# Patient Record
Sex: Male | Born: 1965 | Race: White | Hispanic: No | Marital: Single | Smoking: Current some day smoker
Health system: Southern US, Community
[De-identification: ages and names within clinical notes are randomized; demographics above are authoritative.]

## PROBLEM LIST (undated history)

## (undated) DIAGNOSIS — K219 Gastro-esophageal reflux disease without esophagitis: Secondary | ICD-10-CM

## (undated) HISTORY — DX: Gastro-esophageal reflux disease without esophagitis: K21.9

## (undated) HISTORY — PX: COLONOSCOPY: SHX174

---

## 2019-02-05 ENCOUNTER — Other Ambulatory Visit: Payer: Self-pay | Admitting: Internal Medicine

## 2019-02-05 DIAGNOSIS — R1084 Generalized abdominal pain: Secondary | ICD-10-CM

## 2019-02-05 DIAGNOSIS — R109 Unspecified abdominal pain: Secondary | ICD-10-CM

## 2019-02-08 ENCOUNTER — Other Ambulatory Visit: Payer: Self-pay

## 2019-02-08 ENCOUNTER — Ambulatory Visit
Admission: RE | Admit: 2019-02-08 | Discharge: 2019-02-08 | Disposition: A | Discharge status: Home / Self Care | Payer: PRIVATE HEALTH INSURANCE | Source: Ambulatory Visit | Attending: Internal Medicine | Admitting: Internal Medicine

## 2019-02-08 DIAGNOSIS — R109 Unspecified abdominal pain: Secondary | ICD-10-CM | POA: Diagnosis present

## 2019-02-08 DIAGNOSIS — R1084 Generalized abdominal pain: Secondary | ICD-10-CM | POA: Insufficient documentation

## 2019-02-08 MED ORDER — IOHEXOL 300 MG/ML  SOLN
100.0000 mL | Freq: Once | INTRAMUSCULAR | Status: AC | PRN
  Administered 2019-02-08: 100 mL via INTRAVENOUS

## 2019-04-02 ENCOUNTER — Other Ambulatory Visit: Payer: Self-pay | Admitting: Internal Medicine

## 2019-04-02 ENCOUNTER — Other Ambulatory Visit: Payer: Self-pay

## 2019-04-02 ENCOUNTER — Ambulatory Visit
Admission: RE | Admit: 2019-04-02 | Discharge: 2019-04-02 | Disposition: A | Discharge status: Home / Self Care | Payer: PRIVATE HEALTH INSURANCE | Source: Ambulatory Visit | Attending: Internal Medicine | Admitting: Internal Medicine

## 2019-04-02 DIAGNOSIS — K810 Acute cholecystitis: Secondary | ICD-10-CM

## 2019-04-15 ENCOUNTER — Ambulatory Visit (INDEPENDENT_AMBULATORY_CARE_PROVIDER_SITE_OTHER): Payer: PRIVATE HEALTH INSURANCE | Admitting: General Surgery

## 2019-04-15 ENCOUNTER — Other Ambulatory Visit: Payer: Self-pay

## 2019-04-15 ENCOUNTER — Encounter: Payer: Self-pay | Admitting: General Surgery

## 2019-04-15 VITALS — BP 151/94 | HR 76 | Temp 97.7°F | Ht 67.0 in | Wt 177.0 lb

## 2019-04-15 DIAGNOSIS — K8020 Calculus of gallbladder without cholecystitis without obstruction: Secondary | ICD-10-CM

## 2019-04-15 MED ORDER — HYOSCYAMINE SULFATE 0.125 MG SL SUBL: 0.1250 mg | SUBLINGUAL_TABLET | SUBLINGUAL | 0 refills | Status: AC | PRN

## 2019-04-15 NOTE — H&P (View-Only) (Signed)
Patient ID: Alejandro Patel, male   DOB: 02/11/1966, 53 y.o.   MRN: [id number]  Chief Complaint  Patient presents with  . Abdominal Pain    HPI Alejandro Patel is a 53 y.o. male here today for a evaluation of his gallbladder. Patient states he has been having pain off and on for three years now, located in his right upper quadrant. He states his last attack was last week. Nauseas and vomiting. Move his bowels daily. He had a ultrasound and ct scan done.  The patient's first severe episode, bad enough to bring him to medical attention, was in early March.  A CT scan at that time documented cholelithiasis, but the radiologist attention had been directed towards the right lower quadrant where a normal appendix was appreciated.  Since that time the patient has had episodes with pain and nausea, relieved by vomiting.  One episode was triggered by pizza, another by chicken.  The patient has been fairly asymptomatic for the last week.  The next severe episode was on May 1 at which time he was evaluated again by his primary care physician at which time an abdominal ultrasound was obtained.  This showed findings suggestive of cholelithiasis.  A telemedicine visit with Dr. Sakai was undertaken on May 4 and then in an office visit on Apr 13, 2019.  The patient had been frustrated with the mixed messages about the availability for surgery, the confusion the result of the recent shutdown of elective surgery due to the [place] Corona virus pandemic.  He recall that I had cared for his mother Alejandro Patel years ago, and came for a second opinion looking for nonsurgical options.  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     HPI  Past Medical History:  Diagnosis Date  . GERD (gastroesophageal reflux disease)     History reviewed. No pertinent surgical history.  History reviewed. No pertinent family history.  Social History Social History   Tobacco Use  . Smoking status: Current Every Day  Smoker    Packs/day: 0.25  . Smokeless tobacco: Never Used  Substance Use Topics  . Alcohol use: Yes  . Drug use: Never    Allergies  Allergen Reactions  . Codeine Hives and Itching    Current Outpatient Medications  Medication Sig Dispense Refill  . famotidine (PEPCID) 40 MG tablet TAKE 1 TABLET BY MOUTH EVERY DAY AT NIGHT    . pantoprazole (PROTONIX) 40 MG tablet Take by mouth.    . Zinc Sulfate (ZINC 15 PO) Take by mouth.    . hyoscyamine (LEVSIN SL) 0.125 MG SL tablet Place 1 tablet (0.125 mg total) under the tongue every 4 (four) hours as needed. 10 tablet 0   No current facility-administered medications for this visit.     Review of Systems Review of Systems  Constitutional: Negative.   Respiratory: Negative.   Gastrointestinal: Positive for abdominal pain, nausea and vomiting.    Blood pressure (!) 151/94, pulse 76, temperature 97.7 F (36.5 C), temperature source Skin, height 5' 7" (1.702 m), weight 177 lb (80.3 kg), SpO2 98 %.  Physical Exam Physical Exam Constitutional:      Appearance: He is well-developed.  HENT:     Mouth/Throat:     Pharynx: No oropharyngeal exudate.  Eyes:     General: No scleral icterus.    Conjunctiva/sclera: Conjunctivae normal.  Neck:     Musculoskeletal: Neck supple.  Cardiovascular:     Rate and Rhythm: Normal rate and   regular rhythm.     Pulses:          Femoral pulses are 2+ on the right side and 2+ on the left side.    Heart sounds: Normal heart sounds.  Pulmonary:     Effort: Pulmonary effort is normal.     Breath sounds: Normal breath sounds.  Abdominal:     General: Bowel sounds are normal.     Palpations: Abdomen is soft.     Tenderness: There is abdominal tenderness.    Musculoskeletal:     Right lower leg: No edema.     Left lower leg: No edema.  Skin:    General: Skin is warm and dry.  Neurological:     Mental Status: He is alert and oriented to person, place, and time.  Psychiatric:        Behavior:  Behavior normal.     Data Reviewed I independently reviewed the February 09, 2019 CT of the abdomen.  There appeared to be increased enhancement of the gallbladder wall as well as the identified cholelithiasis noted by the radiologist.  The abdominal ultrasound of Apr 02, 2019 was also independently reviewed showing modest gallbladder wall thickening at 4.1 mm.  Common bile duct was 2.9 cm.  Sludge and gallstones measuring up to 1.5 cm were noted.  A negative sonographic Eulah Pont sign was reported.  Laboratory studies dated February 05, 2019 showed a white blood cell count of 13,800 with a hemoglobin of 16.5 and an MCV of 99.  Normal platelet count of 225,000.  Normal differential with 66% neutrophils.  Comprehensive metabolic panel the same date was unremarkable.  Normal liver function studies.  Normal creatinine at 1.0 with an estimated GFR 78.  Low blood sugar of 63.  Repeat CBC today showed a normal white blood cell count of 9500 and a normal differential.   Assessment Chronic cholecystitis and cholelithiasis with intermittent exacerbation.  Plan  The patient is very much a believer in the almanac and doing events during the appropriate "sign".  He does not want to have surgery at this time unless absolutely necessary, preferring to defer until the June 8-12, 2020 window.  With a normal CBC and his report of marked improvement with the as needed use of meloxicam, I think this timeline can be accommodated.  I have asked him to make use of the meloxicam for 7.5 mg daily (he presently has some from years ago) and was prescribed Levsin SL to use 1 tablet sublingually as needed for severe pain.  He is to report if he requires use of this more than once or twice.  Laparoscopic Cholecystectomy with Intraoperative Cholangiogram. The procedure, including it's potential risks and complications (including but not limited to infection, bleeding, injury to intra-abdominal organs or bile ducts, bile leak, poor  cosmetic result, sepsis and death) were discussed with the patient in detail. Non-operative options, including their inherent risks (acute calculous cholecystitis with possible choledocholithiasis or gallstone pancreatitis, with the risk of ascending cholangitis, sepsis, and death) were discussed as well. The patient expressed and understanding of what we discussed and wishes to proceed with laparoscopic cholecystectomy. The patient further understands that if it is technically not possible, or it is unsafe to proceed laparoscopically, that I will convert to an open cholecystectomy.   HPI, assessment, plan and physical exam has been scribed under the direction and in the presence of Earline Mayotte, MD. Dorathy Daft, RN  HPI, Physical Exam, Assessment and Plan have been scribed under  the direction and in the presence of Donnalee CurryJeffrey Rosabelle Jupin, MD.  Ples SpecterJessica Qualls, CMA  I have completed the exam and reviewed the above documentation for accuracy and completeness.  I agree with the above.  [employer] has been used and any errors in dictation or transcription are unintentional.  Donnalee CurryJeffrey Teran Knittle, M.D., F.A.C.S.  Merrily PewJeffrey W Kyliegh Jester 04/16/2019, 8:08 AM

## 2019-04-15 NOTE — Progress Notes (Signed)
Patient ID: Alejandro Patel, male   DOB: [birth year], 53 y.o.   MRN: [id number]  Chief Complaint  Patient presents with  . Abdominal Pain    HPI Alejandro Patel is a 53 y.o. male here today for a evaluation of his gallbladder. Patient states he has been having pain off and on for three years now, located in his right upper quadrant. He states his last attack was last week. Nauseas and vomiting. Move his bowels daily. He had a ultrasound and ct scan done.  The patient's first severe episode, bad enough to bring him to medical attention, was in early March.  A CT scan at that time documented cholelithiasis, but the radiologist attention had been directed towards the right lower quadrant where a normal appendix was appreciated.  Since that time the patient has had episodes with pain and nausea, relieved by vomiting.  One episode was triggered by pizza, another by chicken.  The patient has been fairly asymptomatic for the last week.  The next severe episode was on May 1 at which time he was evaluated again by his primary care physician at which time an abdominal ultrasound was obtained.  This showed findings suggestive of cholelithiasis.  A telemedicine visit with Dr. Tonna Boehringer was undertaken on May 4 and then in an office visit on Apr 13, 2019.  The patient had been frustrated with the mixed messages about the availability for surgery, the confusion the result of the recent shutdown of elective surgery due to the [facility] virus pandemic.  He recall that I had cared for his mother Alejandro Patel years ago, and came for a second opinion looking for nonsurgical options.  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     HPI  Past Medical History:  Diagnosis Date  . GERD (gastroesophageal reflux disease)     History reviewed. No pertinent surgical history.  History reviewed. No pertinent family history.  Social History Social History   Tobacco Use  . Smoking status: Current Every Day  Smoker    Packs/day: 0.25  . Smokeless tobacco: Never Used  Substance Use Topics  . Alcohol use: Yes  . Drug use: Never    Allergies  Allergen Reactions  . Codeine Hives and Itching    Current Outpatient Medications  Medication Sig Dispense Refill  . famotidine (PEPCID) 40 MG tablet TAKE 1 TABLET BY MOUTH EVERY DAY AT NIGHT    . pantoprazole (PROTONIX) 40 MG tablet Take by mouth.    . Zinc Sulfate (ZINC 15 PO) Take by mouth.    . hyoscyamine (LEVSIN SL) 0.125 MG SL tablet Place 1 tablet (0.125 mg total) under the tongue every 4 (four) hours as needed. 10 tablet 0   No current facility-administered medications for this visit.     Review of Systems Review of Systems  Constitutional: Negative.   Respiratory: Negative.   Gastrointestinal: Positive for abdominal pain, nausea and vomiting.    Blood pressure (!) 151/94, pulse 76, temperature 97.7 F (36.5 C), temperature source Skin, height  (1.702 m), weight 177 lb (80.3 kg), SpO2 98 %.  Physical Exam Physical Exam Constitutional:      Appearance: He is well-developed.  HENT:     Mouth/Throat:     Pharynx: No oropharyngeal exudate.  Eyes:     General: No scleral icterus.    Conjunctiva/sclera: Conjunctivae normal.  Neck:     Musculoskeletal: Neck supple.  Cardiovascular:     Rate and Rhythm: Normal rate and  regular rhythm.     Pulses:          Femoral pulses are 2+ on the right side and 2+ on the left side.    Heart sounds: Normal heart sounds.  Pulmonary:     Effort: Pulmonary effort is normal.     Breath sounds: Normal breath sounds.  Abdominal:     General: Bowel sounds are normal.     Palpations: Abdomen is soft.     Tenderness: There is abdominal tenderness.    Musculoskeletal:     Right lower leg: No edema.     Left lower leg: No edema.  Skin:    General: Skin is warm and dry.  Neurological:     Mental Status: He is alert and oriented to person, place, and time.  Psychiatric:        Behavior:  Behavior normal.     Data Reviewed I independently reviewed the February 09, 2019 CT of the abdomen.  There appeared to be increased enhancement of the gallbladder wall as well as the identified cholelithiasis noted by the radiologist.  The abdominal ultrasound of Apr 02, 2019 was also independently reviewed showing modest gallbladder wall thickening at 4.1 mm.  Common bile duct was 2.9 cm.  Sludge and gallstones measuring up to 1.5 cm were noted.  A negative sonographic Eulah Pont sign was reported.  Laboratory studies dated February 05, 2019 showed a white blood cell count of 13,800 with a hemoglobin of 16.5 and an MCV of 99.  Normal platelet count of 225,000.  Normal differential with 66% neutrophils.  Comprehensive metabolic panel the same date was unremarkable.  Normal liver function studies.  Normal creatinine at 1.0 with an estimated GFR 78.  Low blood sugar of 63.  Repeat CBC today showed a normal white blood cell count of 9500 and a normal differential.   Assessment Chronic cholecystitis and cholelithiasis with intermittent exacerbation.  Plan  The patient is very much a believer in the almanac and doing events during the appropriate "sign".  He does not want to have surgery at this time unless absolutely necessary, preferring to defer until the June 8-12, 2020 window.  With a normal CBC and his report of marked improvement with the as needed use of meloxicam, I think this timeline can be accommodated.  I have asked him to make use of the meloxicam for 7.5 mg daily (he presently has some from years ago) and was prescribed Levsin SL to use 1 tablet sublingually as needed for severe pain.  He is to report if he requires use of this more than once or twice.  Laparoscopic Cholecystectomy with Intraoperative Cholangiogram. The procedure, including it's potential risks and complications (including but not limited to infection, bleeding, injury to intra-abdominal organs or bile ducts, bile leak, poor  cosmetic result, sepsis and death) were discussed with the patient in detail. Non-operative options, including their inherent risks (acute calculous cholecystitis with possible choledocholithiasis or gallstone pancreatitis, with the risk of ascending cholangitis, sepsis, and death) were discussed as well. The patient expressed and understanding of what we discussed and wishes to proceed with laparoscopic cholecystectomy. The patient further understands that if it is technically not possible, or it is unsafe to proceed laparoscopically, that I will convert to an open cholecystectomy.   HPI, assessment, plan and physical exam has been scribed under the direction and in the presence of Earline Mayotte, MD. Dorathy Daft, RN  HPI, Physical Exam, Assessment and Plan have been scribed under  the direction and in the presence of Donnalee CurryJeffrey Takeysha Bonk, MD.  Ples SpecterJessica Qualls, CMA  I have completed the exam and reviewed the above documentation for accuracy and completeness.  I agree with the above.  [employer]gallery [employer] has been used and any errors in dictation or transcription are unintentional.  Donnalee CurryJeffrey Lula Michaux, M.D., F.A.C.S.  Merrily PewJeffrey W Martia Dalby 04/16/2019, 8:08 AM

## 2019-04-15 NOTE — Patient Instructions (Addendum)
The patient is aware to call back for any questions or new concerns. Follow up to schedule surgery   Laparoscopic Cholecystectomy Laparoscopic cholecystectomy is surgery to remove the gallbladder. The gallbladder is a pear-shaped organ that lies beneath the liver on the right side of the body. The gallbladder stores bile, which is a fluid that helps the body to digest fats. Cholecystectomy is often done for inflammation of the gallbladder (cholecystitis). This condition is usually caused by a buildup of gallstones (cholelithiasis) in the gallbladder. Gallstones can block the flow of bile, which can result in inflammation and pain. In severe cases, emergency surgery may be required. This procedure is done though small incisions in your abdomen (laparoscopic surgery). A thin scope with a camera (laparoscope) is inserted through one incision. Thin surgical instruments are inserted through the other incisions. In some cases, a laparoscopic procedure may be turned into a type of surgery that is done through a larger incision (open surgery). Tell a health care provider about:  Any allergies you have.  All medicines you are taking, including vitamins, herbs, eye drops, creams, and over-the-counter medicines.  Any problems you or family members have had with anesthetic medicines.  Any blood disorders you have.  Any surgeries you have had.  Any medical conditions you have.  Whether you are pregnant or may be pregnant. What are the risks? Generally, this is a safe procedure. However, problems may occur, including:  Infection.  Bleeding.  Allergic reactions to medicines.  Damage to other structures or organs.  A stone remaining in the common bile duct. The common bile duct carries bile from the gallbladder into the small intestine.  A bile leak from the cyst duct that is clipped when your gallbladder is removed. What happens before the procedure? Staying hydrated Follow instructions from  your health care provider about hydration, which may include:  Up to 2 hours before the procedure - you may continue to drink clear liquids, such as water, clear fruit juice, black coffee, and plain tea. Eating and drinking restrictions Follow instructions from your health care provider about eating and drinking, which may include:  8 hours before the procedure - stop eating heavy meals or foods such as meat, fried foods, or fatty foods.  6 hours before the procedure - stop eating light meals or foods, such as toast or cereal.  6 hours before the procedure - stop drinking milk or drinks that contain milk.  2 hours before the procedure - stop drinking clear liquids. Medicines  Ask your health care provider about: ? Changing or stopping your regular medicines. This is especially important if you are taking diabetes medicines or blood thinners. ? Taking medicines such as aspirin and ibuprofen. These medicines can thin your blood. Do not take these medicines before your procedure if your health care provider instructs you not to.  You may be given antibiotic medicine to help prevent infection. General instructions  Let your health care provider know if you develop a cold or an infection before surgery.  Plan to have someone take you home from the hospital or clinic.  Ask your health care provider how your surgical site will be marked or identified. What happens during the procedure?   To reduce your risk of infection: ? Your health care team will wash or sanitize their hands. ? Your skin will be washed with soap. ? Hair may be removed from the surgical area.  An IV tube may be inserted into one of your veins.  You will be given one or more of the following: ? A medicine to help you relax (sedative). ? A medicine to make you fall asleep (general anesthetic).  A breathing tube will be placed in your mouth.  Your surgeon will make several small cuts (incisions) in your  abdomen.  The laparoscope will be inserted through one of the small incisions. The camera on the laparoscope will send images to a TV screen (monitor) in the operating room. This lets your surgeon see inside your abdomen.  Air-like gas will be pumped into your abdomen. This will expand your abdomen to give the surgeon more room to perform the surgery.  Other tools that are needed for the procedure will be inserted through the other incisions. The gallbladder will be removed through one of the incisions.  Your common bile duct may be examined. If stones are found in the common bile duct, they may be removed.  After your gallbladder has been removed, the incisions will be closed with stitches (sutures), staples, or skin glue.  Your incisions may be covered with a bandage (dressing). The procedure may vary among health care providers and hospitals. What happens after the procedure?  Your blood pressure, heart rate, breathing rate, and blood oxygen level will be monitored until the medicines you were given have worn off.  You will be given medicines as needed to control your pain.  Do not drive for 24 hours if you were given a sedative. This information is not intended to replace advice given to you by your health care provider. Make sure you discuss any questions you have with your health care provider. Document Released: 11/18/2005 Document Revised: 10/16/2017 Document Reviewed: 05/06/2016 Elsevier Interactive Patient Education  2019 [employer].

## 2019-04-16 ENCOUNTER — Telehealth: Payer: Self-pay | Admitting: *Deleted

## 2019-04-16 ENCOUNTER — Other Ambulatory Visit: Payer: Self-pay | Admitting: General Surgery

## 2019-04-16 DIAGNOSIS — K8020 Calculus of gallbladder without cholecystitis without obstruction: Secondary | ICD-10-CM

## 2019-04-16 LAB — CBC WITH DIFFERENTIAL/PLATELET
Basophils Absolute: 0 10*3/uL (ref 0.0–0.2)
Basos: 0 %
EOS (ABSOLUTE): 0.2 10*3/uL (ref 0.0–0.4)
Eos: 2 %
Hematocrit: 46.6 % (ref 37.5–51.0)
Hemoglobin: 16.9 g/dL (ref 13.0–17.7)
Immature Grans (Abs): 0 10*3/uL (ref 0.0–0.1)
Immature Granulocytes: 0 %
Lymphocytes Absolute: 2.6 10*3/uL (ref 0.7–3.1)
Lymphs: 28 %
MCH: 33.5 pg — ABNORMAL HIGH (ref 26.6–33.0)
MCHC: 36.3 g/dL — ABNORMAL HIGH (ref 31.5–35.7)
MCV: 92 fL (ref 79–97)
Monocytes Absolute: 0.6 10*3/uL (ref 0.1–0.9)
Monocytes: 6 %
Neutrophils Absolute: 6 10*3/uL (ref 1.4–7.0)
Neutrophils: 64 %
Platelets: 165 10*3/uL (ref 150–450)
RBC: 5.05 x10E6/uL (ref 4.14–5.80)
RDW: 12.4 % (ref 11.6–15.4)
WBC: 9.5 10*3/uL (ref 3.4–10.8)

## 2019-04-16 MED ORDER — TRAMADOL HCL 50 MG PO TABS: 50.0000 mg | ORAL_TABLET | ORAL | 0 refills | Status: AC | PRN

## 2019-04-16 NOTE — Telephone Encounter (Signed)
Patient notified per previous message.   He does report he did need to use Levsin last night. He states he did vomit and feels better today.   Patient's surgery to be scheduled for 05-10-19 at [facility] with Dr. Lemar Livings.  COVID testing to be done on 05-06-19 at the [facility] drive thru between 75:88 am and 12:30 pm.  The patient is aware he will be contacted by the Pre-Admission Testing Department to complete a phone interview sometime in the near future.  Patient aware to be NPO after midnight and have a driver.   He is aware to check in at [facility] where he will be screened for the coronavirus and then sent to Same Day Surgery.   Patient aware that he may have no visitors and driver will need to wait in the car due to COVID-19 restrictions.   The patient verbalizes understanding of the above.   The patient is aware to call the office should he have further questions.

## 2019-04-16 NOTE — Telephone Encounter (Signed)
-----   Message from Earline Mayotte, MD sent at 04/16/2019  7:50 AM EDT ----- Please notify the patient that yesterday's white count was fine, back to normal.  Working to shoot for getting his gallbladder out in that window of May 10, 2011 that he desired.  He should continue on his Mobic daily, and let the office know if he needs to use the Levsin for breakthrough pain.  Remind him to avoid fatty foods.  Thank you

## 2019-05-04 ENCOUNTER — Other Ambulatory Visit: Payer: Self-pay

## 2019-05-04 ENCOUNTER — Encounter
Admission: RE | Admit: 2019-05-04 | Discharge: 2019-05-04 | Disposition: A | Discharge status: Home / Self Care | Payer: PRIVATE HEALTH INSURANCE | Source: Ambulatory Visit | Attending: General Surgery | Admitting: General Surgery

## 2019-05-04 NOTE — Patient Instructions (Signed)
Your procedure is scheduled on: Mon. 05/10/19 Report to Day Surgery. To find out your arrival time please call [phone] between 1PM - 3PM on Friday 05/07/19  Remember: Instructions that are not followed completely may result in serious medical risk,  up to and including death, or upon the discretion of your surgeon and anesthesiologist your  surgery may need to be rescheduled.     _X__ 1. Do not eat food after midnight the night before your procedure.                 No gum chewing or hard candies. You may drink clear liquids up to 2 hours                 before you are scheduled to arrive for your surgery- DO not drink clear                 liquids within 2 hours of the start of your surgery.                 Clear Liquids include:  water, apple juice without pulp, clear carbohydrate                 drink such as Clearfast of Gatorade, Black Coffee or Tea (Do not add                 anything to coffee or tea).  __X__2.  On the morning of surgery brush your teeth with toothpaste and water, you                may rinse your mouth with mouthwash if you wish.  Do not swallow any toothpaste of mouthwash.     _X__ 3.  No Alcohol for 24 hours before or after surgery.   _X__ 4.  Do Not Smoke or use e-cigarettes For 24 Hours Prior to Your Surgery.                 Do not use any chewable tobacco products for at least 6 hours prior to                 surgery.  ____  5.  Bring all medications with you on the day of surgery if instructed.   __x__  6.  Notify your doctor if there is any change in your medical condition      (cold, fever, infections).     Do not wear jewelry, make-up, hairpins, clips or nail polish. Do not wear lotions, powders, or perfumes. You may wear deodorant. Do not shave 48 hours prior to surgery. Men may shave face and neck. Do not bring valuables to the hospital.    [facility] not [facility] any belongings or valuables.  Contacts,  dentures or bridgework may not be worn into surgery. Leave your suitcase in the car. After surgery it may be brought to your room. For patients admitted to the hospital, discharge time is determined by your treatment team.   Patients discharged the day of surgery will not be allowed to drive home.   Please read over the following fact sheets that you were given:    __x__ Take these medicines the morning of surgery with A SIP OF WATER:    1. famotidine (PEPCID) 40 MG tablet  2.   3.   4.  5.  6.  ____ Fleet Enema (as directed)   _x___ Use CHG Soap as directed  ____ Use inhalers  on the day of surgery  ____ Stop metformin 2 days prior to surgery    ____ Take 1/2 of usual insulin dose the night before surgery. No insulin the morning          of surgery.   ____ Stop Coumadin/Plavix/aspirin on   __x__ Stop Anti-inflammatories aleve or ibuprofen now.  May take tylenol and tramadol   ____ Stop supplements until after surgery.    ____ Bring C-Pap to the hospital.

## 2019-05-05 ENCOUNTER — Other Ambulatory Visit: Payer: Self-pay

## 2019-05-06 ENCOUNTER — Other Ambulatory Visit
Admission: RE | Admit: 2019-05-06 | Discharge: 2019-05-06 | Disposition: A | Discharge status: Home / Self Care | Payer: PRIVATE HEALTH INSURANCE | Source: Ambulatory Visit | Attending: General Surgery | Admitting: General Surgery

## 2019-05-06 ENCOUNTER — Other Ambulatory Visit: Payer: Self-pay | Admitting: General Surgery

## 2019-05-06 ENCOUNTER — Other Ambulatory Visit: Payer: Self-pay

## 2019-05-06 DIAGNOSIS — Z1159 Encounter for screening for other viral diseases: Secondary | ICD-10-CM | POA: Diagnosis present

## 2019-05-06 MED ORDER — HYDROCODONE-ACETAMINOPHEN 5-325 MG PO TABS: 1.0000 | ORAL_TABLET | ORAL | 0 refills | Status: DC | PRN

## 2019-05-07 LAB — NOVEL CORONAVIRUS, NAA (HOSP ORDER, SEND-OUT TO REF LAB; TAT 18-24 HRS): SARS-CoV-2, NAA: NOT DETECTED

## 2019-05-10 ENCOUNTER — Other Ambulatory Visit: Payer: Self-pay

## 2019-05-10 ENCOUNTER — Telehealth: Payer: Self-pay | Admitting: Gastroenterology

## 2019-05-10 ENCOUNTER — Encounter: Payer: Self-pay | Admitting: *Deleted

## 2019-05-10 ENCOUNTER — Ambulatory Visit: Payer: PRIVATE HEALTH INSURANCE | Admitting: Anesthesiology

## 2019-05-10 ENCOUNTER — Ambulatory Visit: Payer: PRIVATE HEALTH INSURANCE

## 2019-05-10 ENCOUNTER — Ambulatory Visit
Admission: RE | Admit: 2019-05-10 | Discharge: 2019-05-10 | Disposition: A | Discharge status: Home / Self Care | Payer: PRIVATE HEALTH INSURANCE | Attending: General Surgery | Admitting: General Surgery

## 2019-05-10 ENCOUNTER — Encounter
Admission: RE | Disposition: A | Discharge status: Home / Self Care | Payer: Self-pay | Source: Home / Self Care | Attending: General Surgery

## 2019-05-10 DIAGNOSIS — K8020 Calculus of gallbladder without cholecystitis without obstruction: Secondary | ICD-10-CM

## 2019-05-10 DIAGNOSIS — K8012 Calculus of gallbladder with acute and chronic cholecystitis without obstruction: Secondary | ICD-10-CM | POA: Insufficient documentation

## 2019-05-10 DIAGNOSIS — K219 Gastro-esophageal reflux disease without esophagitis: Secondary | ICD-10-CM | POA: Insufficient documentation

## 2019-05-10 DIAGNOSIS — F17210 Nicotine dependence, cigarettes, uncomplicated: Secondary | ICD-10-CM | POA: Diagnosis not present

## 2019-05-10 DIAGNOSIS — K760 Fatty (change of) liver, not elsewhere classified: Secondary | ICD-10-CM | POA: Insufficient documentation

## 2019-05-10 DIAGNOSIS — R109 Unspecified abdominal pain: Secondary | ICD-10-CM | POA: Diagnosis present

## 2019-05-10 DIAGNOSIS — K8050 Calculus of bile duct without cholangitis or cholecystitis without obstruction: Secondary | ICD-10-CM

## 2019-05-10 DIAGNOSIS — Z419 Encounter for procedure for purposes other than remedying health state, unspecified: Secondary | ICD-10-CM

## 2019-05-10 HISTORY — PX: CHOLECYSTECTOMY: SHX55

## 2019-05-10 LAB — HEPATIC FUNCTION PANEL
ALT: 24 U/L (ref 0–44)
AST: 26 U/L (ref 15–41)
Albumin: 4 g/dL (ref 3.5–5.0)
Alkaline Phosphatase: 59 U/L (ref 38–126)
Bilirubin, Direct: 0.2 mg/dL (ref 0.0–0.2)
Indirect Bilirubin: 0.8 mg/dL (ref 0.3–0.9)
Total Bilirubin: 1 mg/dL (ref 0.3–1.2)
Total Protein: 6.1 g/dL — ABNORMAL LOW (ref 6.5–8.1)

## 2019-05-10 LAB — LIPASE, BLOOD: Lipase: 33 U/L (ref 11–51)

## 2019-05-10 SURGERY — LAPAROSCOPIC CHOLECYSTECTOMY WITH INTRAOPERATIVE CHOLANGIOGRAM
Anesthesia: General

## 2019-05-10 MED ORDER — ONDANSETRON HCL 4 MG/2ML IJ SOLN
INTRAMUSCULAR | Status: AC
  Filled 2019-05-10: qty 2

## 2019-05-10 MED ORDER — PROPOFOL 10 MG/ML IV BOLUS
INTRAVENOUS | Status: AC
  Filled 2019-05-10: qty 20

## 2019-05-10 MED ORDER — DEXAMETHASONE SODIUM PHOSPHATE 10 MG/ML IJ SOLN
INTRAMUSCULAR | Status: DC | PRN
  Administered 2019-05-10: 10 mg via INTRAVENOUS

## 2019-05-10 MED ORDER — ALBUTEROL SULFATE HFA 108 (90 BASE) MCG/ACT IN AERS
INHALATION_SPRAY | RESPIRATORY_TRACT | Status: DC | PRN
  Administered 2019-05-10: 6 via RESPIRATORY_TRACT

## 2019-05-10 MED ORDER — FENTANYL CITRATE (PF) 100 MCG/2ML IJ SOLN
INTRAMUSCULAR | Status: DC | PRN
  Administered 2019-05-10: 100 ug via INTRAVENOUS
  Administered 2019-05-10: 50 ug via INTRAVENOUS
  Administered 2019-05-10: 100 ug via INTRAVENOUS

## 2019-05-10 MED ORDER — GABAPENTIN 300 MG PO CAPS
300.0000 mg | ORAL_CAPSULE | ORAL | Status: AC
  Administered 2019-05-10: 07:00:00 300 mg via ORAL

## 2019-05-10 MED ORDER — SODIUM CHLORIDE 0.9 % IV SOLN
INTRAVENOUS | Status: DC | PRN
  Administered 2019-05-10: 35 mL

## 2019-05-10 MED ORDER — FENTANYL CITRATE (PF) 100 MCG/2ML IJ SOLN: 25.0000 ug | INTRAMUSCULAR | Status: DC | PRN

## 2019-05-10 MED ORDER — ONDANSETRON HCL 4 MG/2ML IJ SOLN
INTRAMUSCULAR | Status: DC | PRN
  Administered 2019-05-10: 4 mg via INTRAVENOUS

## 2019-05-10 MED ORDER — FENTANYL CITRATE (PF) 250 MCG/5ML IJ SOLN
INTRAMUSCULAR | Status: AC
  Filled 2019-05-10: qty 5

## 2019-05-10 MED ORDER — LIDOCAINE 2% (20 MG/ML) 5 ML SYRINGE
INTRAMUSCULAR | Status: DC | PRN
  Administered 2019-05-10: 100 mg via INTRAVENOUS

## 2019-05-10 MED ORDER — ALBUTEROL SULFATE HFA 108 (90 BASE) MCG/ACT IN AERS
INHALATION_SPRAY | RESPIRATORY_TRACT | Status: AC
  Filled 2019-05-10: qty 6.7

## 2019-05-10 MED ORDER — DEXAMETHASONE SODIUM PHOSPHATE 10 MG/ML IJ SOLN
INTRAMUSCULAR | Status: AC
  Filled 2019-05-10: qty 1

## 2019-05-10 MED ORDER — HYDRALAZINE HCL 20 MG/ML IJ SOLN
INTRAMUSCULAR | Status: DC | PRN
  Administered 2019-05-10: 5 mg via INTRAVENOUS
  Administered 2019-05-10: 15 mg via INTRAVENOUS

## 2019-05-10 MED ORDER — SODIUM CHLORIDE 0.9 % IV SOLN: INTRAVENOUS | Status: DC | PRN

## 2019-05-10 MED ORDER — SUGAMMADEX SODIUM 200 MG/2ML IV SOLN
INTRAVENOUS | Status: DC | PRN
  Administered 2019-05-10: 154.2 mg via INTRAVENOUS

## 2019-05-10 MED ORDER — ROCURONIUM BROMIDE 100 MG/10ML IV SOLN
INTRAVENOUS | Status: DC | PRN
  Administered 2019-05-10: 40 mg via INTRAVENOUS

## 2019-05-10 MED ORDER — LACTATED RINGERS IV SOLN
INTRAVENOUS | Status: DC
  Administered 2019-05-10: 07:00:00 via INTRAVENOUS

## 2019-05-10 MED ORDER — SEVOFLURANE IN SOLN
RESPIRATORY_TRACT | Status: AC
  Filled 2019-05-10: qty 250

## 2019-05-10 MED ORDER — LABETALOL HCL 5 MG/ML IV SOLN
INTRAVENOUS | Status: DC | PRN
  Administered 2019-05-10: 10 mg via INTRAVENOUS
  Administered 2019-05-10 (×2): 5 mg via INTRAVENOUS

## 2019-05-10 MED ORDER — LIDOCAINE HCL (PF) 2 % IJ SOLN
INTRAMUSCULAR | Status: AC
  Filled 2019-05-10: qty 10

## 2019-05-10 MED ORDER — SUGAMMADEX SODIUM 200 MG/2ML IV SOLN
INTRAVENOUS | Status: AC
  Filled 2019-05-10: qty 2

## 2019-05-10 MED ORDER — ACETAMINOPHEN 10 MG/ML IV SOLN
INTRAVENOUS | Status: DC | PRN
  Administered 2019-05-10: 1000 mg via INTRAVENOUS

## 2019-05-10 MED ORDER — ROCURONIUM BROMIDE 50 MG/5ML IV SOLN
INTRAVENOUS | Status: AC
  Filled 2019-05-10: qty 1

## 2019-05-10 MED ORDER — SODIUM CHLORIDE (PF) 0.9 % IJ SOLN
INTRAMUSCULAR | Status: AC
  Filled 2019-05-10: qty 50

## 2019-05-10 MED ORDER — MIDAZOLAM HCL 2 MG/2ML IJ SOLN
INTRAMUSCULAR | Status: AC
  Filled 2019-05-10: qty 2

## 2019-05-10 MED ORDER — KETOROLAC TROMETHAMINE 30 MG/ML IJ SOLN
INTRAMUSCULAR | Status: DC | PRN
  Administered 2019-05-10: 30 mg via INTRAVENOUS

## 2019-05-10 MED ORDER — MIDAZOLAM HCL 2 MG/2ML IJ SOLN
INTRAMUSCULAR | Status: DC | PRN
  Administered 2019-05-10: 2 mg via INTRAVENOUS

## 2019-05-10 MED ORDER — SUCCINYLCHOLINE CHLORIDE 20 MG/ML IJ SOLN
INTRAMUSCULAR | Status: AC
  Filled 2019-05-10: qty 1

## 2019-05-10 MED ORDER — GABAPENTIN 300 MG PO CAPS
ORAL_CAPSULE | ORAL | Status: AC
  Administered 2019-05-10: 300 mg via ORAL
  Filled 2019-05-10: qty 1

## 2019-05-10 MED ORDER — PROMETHAZINE HCL 25 MG/ML IJ SOLN: 6.2500 mg | INTRAMUSCULAR | Status: DC | PRN

## 2019-05-10 MED ORDER — PROPOFOL 10 MG/ML IV BOLUS
INTRAVENOUS | Status: DC | PRN
  Administered 2019-05-10: 150 mg via INTRAVENOUS

## 2019-05-10 SURGICAL SUPPLY — 41 items
APPLIER CLIP ROT 10 11.4 M/L (STAPLE) ×3
BLADE SURG 11 STRL SS SAFETY (MISCELLANEOUS) ×3 IMPLANT
CANISTER SUCT 1200ML W/VALVE (MISCELLANEOUS) ×3 IMPLANT
CANNULA DILATOR  5MM W/SLV (CANNULA) ×2
CANNULA DILATOR 10 W/SLV (CANNULA) ×2 IMPLANT
CANNULA DILATOR 10MM W/SLV (CANNULA) ×1
CANNULA DILATOR 5 W/SLV (CANNULA) ×4 IMPLANT
CATH CHOLANG 76X19 KUMAR (CATHETERS) ×3 IMPLANT
CHLORAPREP W/TINT 26 (MISCELLANEOUS) ×3 IMPLANT
CLIP APPLIE ROT 10 11.4 M/L (STAPLE) ×1 IMPLANT
CLOSURE WOUND 1/2 X4 (GAUZE/BANDAGES/DRESSINGS) ×1
CONRAY 60ML FOR OR (MISCELLANEOUS) ×3 IMPLANT
COVER WAND RF STERILE (DRAPES) ×3 IMPLANT
DISSECTOR KITTNER STICK (MISCELLANEOUS) IMPLANT
DISSECTORS/KITTNER STICK (MISCELLANEOUS)
DRAPE SHEET LG 3/4 BI-LAMINATE (DRAPES) ×3 IMPLANT
DRSG TEGADERM 2-3/8X2-3/4 SM (GAUZE/BANDAGES/DRESSINGS) ×12 IMPLANT
DRSG TELFA 4X3 1S NADH ST (GAUZE/BANDAGES/DRESSINGS) ×6 IMPLANT
ELECT REM PT RETURN 9FT ADLT (ELECTROSURGICAL) ×3
ELECTRODE REM PT RTRN 9FT ADLT (ELECTROSURGICAL) ×1 IMPLANT
GLOVE BIO SURGEON STRL SZ7.5 (GLOVE) ×12 IMPLANT
GLOVE INDICATOR 8.0 STRL GRN (GLOVE) ×9 IMPLANT
GOWN STRL REUS W/ TWL LRG LVL3 (GOWN DISPOSABLE) ×3 IMPLANT
GOWN STRL REUS W/TWL LRG LVL3 (GOWN DISPOSABLE) ×6
IRRIGATION STRYKERFLOW (MISCELLANEOUS) ×1 IMPLANT
IRRIGATOR STRYKERFLOW (MISCELLANEOUS) ×3
IV LACTATED RINGERS 1000ML (IV SOLUTION) ×3 IMPLANT
KIT TURNOVER KIT A (KITS) ×3 IMPLANT
LABEL OR SOLS (LABEL) ×3 IMPLANT
NDL INSUFF ACCESS 14 VERSASTEP (NEEDLE) ×3 IMPLANT
NS IRRIG 500ML POUR BTL (IV SOLUTION) ×3 IMPLANT
PACK LAP CHOLECYSTECTOMY (MISCELLANEOUS) ×3 IMPLANT
POUCH SPECIMEN RETRIEVAL 10MM (ENDOMECHANICALS) IMPLANT
SCISSORS METZENBAUM CVD 33 (INSTRUMENTS) ×3 IMPLANT
SET TUBE SMOKE EVAC HIGH FLOW (TUBING) ×3 IMPLANT
STRIP CLOSURE SKIN 1/2X4 (GAUZE/BANDAGES/DRESSINGS) ×2 IMPLANT
SUT VIC AB 0 CT2 27 (SUTURE) ×3 IMPLANT
SUT VIC AB 4-0 FS2 27 (SUTURE) ×9 IMPLANT
SWABSTK COMLB BENZOIN TINCTURE (MISCELLANEOUS) ×3 IMPLANT
TROCAR XCEL NON-BLD 11X100MML (ENDOMECHANICALS) ×3 IMPLANT
WATER STERILE IRR 1000ML POUR (IV SOLUTION) ×3 IMPLANT

## 2019-05-10 NOTE — Discharge Instructions (Signed)
Laparoscopic Cholecystectomy, Care After °This sheet gives you information about how to care for yourself after your procedure. Your doctor may also give you more specific instructions. If you have problems or questions, contact your doctor. °Follow these instructions at home: °Care for cuts from surgery (incisions) ° °· Follow instructions from your doctor about how to take care of your cuts from surgery. Make sure you: °? Wash your hands with soap and water before you change your bandage (dressing). If you cannot use soap and water, use hand sanitizer. °? Change your bandage as told by your doctor. °? Leave stitches (sutures), skin glue, or skin tape (adhesive) strips in place. They may need to stay in place for 2 weeks or longer. If tape strips get loose and curl up, you may trim the loose edges. Do not remove tape strips completely unless your doctor says it is okay. °· Do not take baths, swim, or use a hot tub until your doctor says it is okay. Ask your doctor if you can take showers. You may only be allowed to take sponge baths for bathing. °· Check your surgical cut area every day for signs of infection. Check for: °? More redness, swelling, or pain. °? More fluid or blood. °? Warmth. °? Pus or a bad smell. °Activity °· Do not drive or use heavy machinery while taking prescription pain medicine. °· Do not lift anything that is heavier than 10 lb (4.5 kg) until your doctor says it is okay. °· Do not play contact sports until your doctor says it is okay. °· Do not drive for 24 hours if you were given a medicine to help you relax (sedative). °· Rest as needed. Do not return to work or school until your doctor says it is okay. °General instructions °· Take over-the-counter and prescription medicines only as told by your doctor. °· To prevent or treat constipation while you are taking prescription pain medicine, your doctor may recommend that you: °? Drink enough fluid to keep your pee (urine) clear or pale  yellow. °? Take over-the-counter or prescription medicines. °? Eat foods that are high in fiber, such as fresh fruits and vegetables, whole grains, and beans. °? Limit foods that are high in fat and processed sugars, such as fried and sweet foods. °Contact a doctor if: °· You develop a rash. °· You have more redness, swelling, or pain around your surgical cuts. °· You have more fluid or blood coming from your surgical cuts. °· Your surgical cuts feel warm to the touch. °· You have pus or a bad smell coming from your surgical cuts. °· You have a fever. °· One or more of your surgical cuts breaks open. °Get help right away if: °· You have trouble breathing. °· You have chest pain. °· You have pain that is getting worse in your shoulders. °· You faint or feel dizzy when you stand. °· You have very bad pain in your belly (abdomen). °· You are sick to your stomach (nauseous) for more than one day. °· You have throwing up (vomiting) that lasts for more than one day. °· You have leg pain. °This information is not intended to replace advice given to you by your health care provider. Make sure you discuss any questions you have with your health care provider. °Document Released: 08/27/2008 Document Revised: 06/08/2016 Document Reviewed: 05/06/2016 °Elsevier Interactive Patient Education © 2019 [employer] ° °AMBULATORY SURGERY  °DISCHARGE INSTRUCTIONS ° ° °1) The drugs that you were given   will stay in your system until tomorrow so for the next 24 hours you should not: ° °A) Drive an automobile °B) Make any legal decisions °C) Drink any alcoholic beverage ° ° °2) You may resume regular meals tomorrow.  Today it is better to start with liquids and gradually work up to solid foods. ° °You may eat anything you prefer, but it is better to start with liquids, then soup and crackers, and gradually work up to solid foods. ° ° °3) Please notify your doctor immediately if you have any unusual bleeding, trouble breathing, redness and  pain at the surgery site, drainage, fever, or pain not relieved by medication. ° ° ° °4) Additional Instructions: ° ° ° ° ° ° ° °Please contact your physician with any problems or Same Day Surgery at [phone], Monday through Friday 6 am to 4 pm, or [facility] Main number at [phone]. ° °

## 2019-05-10 NOTE — Anesthesia Procedure Notes (Signed)
Procedure Name: Intubation Date/Time: 05/10/2019 7:58 AM Performed by: Marsh Dolly, CRNA Pre-anesthesia Checklist: Patient identified, Patient being monitored, Timeout performed, Emergency Drugs available and Suction available Patient Re-evaluated:Patient Re-evaluated prior to induction Oxygen Delivery Method: Circle system utilized Preoxygenation: Pre-oxygenation with 100% oxygen Induction Type: IV induction Ventilation: Mask ventilation without difficulty Laryngoscope Size: 3 and Miller Grade View: Grade I Tube type: Oral Tube size: 7.5 mm Number of attempts: 1 Placement Confirmation: ETT inserted through vocal cords under direct vision,  positive ETCO2 and breath sounds checked- equal and bilateral Secured at: 21 cm Tube secured with: Tape Dental Injury: Teeth and Oropharynx as per pre-operative assessment

## 2019-05-10 NOTE — Anesthesia Preprocedure Evaluation (Signed)
Anesthesia Evaluation  Patient identified by MRN, date of birth, ID band Patient awake    Reviewed: Allergy & Precautions, H&P , NPO status , Patient's Chart, lab work & pertinent test results, reviewed documented beta blocker date and time   History of Anesthesia Complications Negative for: history of anesthetic complications  Airway Mallampati: II  TM Distance: >3 FB Neck ROM: full    Dental  (+) Dental Advidsory Given, Poor Dentition, Missing   Pulmonary neg shortness of breath, neg COPD, neg recent URI, Current Smoker,           Cardiovascular Exercise Tolerance: Good negative cardio ROS       Neuro/Psych negative neurological ROS  negative psych ROS   GI/Hepatic Neg liver ROS, GERD  ,  Endo/Other  negative endocrine ROS  Renal/GU negative Renal ROS  negative genitourinary   Musculoskeletal   Abdominal   Peds  Hematology negative hematology ROS (+)   Anesthesia Other Findings Past Medical History: No date: GERD (gastroesophageal reflux disease)   Reproductive/Obstetrics negative OB ROS                             Anesthesia Physical Anesthesia Plan  ASA: II  Anesthesia Plan: General   Post-op Pain Management:    Induction: Intravenous  PONV Risk Score and Plan: 1 and Ondansetron, Dexamethasone, Midazolam, Promethazine and Treatment may vary due to age or medical condition  Airway Management Planned: Oral ETT  Additional Equipment:   Intra-op Plan:   Post-operative Plan: Extubation in OR  Informed Consent: I have reviewed the patients History and Physical, chart, labs and discussed the procedure including the risks, benefits and alternatives for the proposed anesthesia with the patient or authorized representative who has indicated his/her understanding and acceptance.     Dental Advisory Given  Plan Discussed with: Anesthesiologist, CRNA and Surgeon  Anesthesia  Plan Comments:         Anesthesia Quick Evaluation

## 2019-05-10 NOTE — Anesthesia Postprocedure Evaluation (Signed)
Anesthesia Post Note  Patient: Curran Lenderman  Procedure(s) Performed: LAPAROSCOPIC CHOLECYSTECTOMY WITH INTRAOPERATIVE CHOLANGIOGRAM (N/A )  Patient location during evaluation: PACU Anesthesia Type: General Level of consciousness: awake and alert Pain management: pain level controlled Vital Signs Assessment: post-procedure vital signs reviewed and stable Respiratory status: spontaneous breathing, nonlabored ventilation, respiratory function stable and patient connected to nasal cannula oxygen Cardiovascular status: blood pressure returned to baseline and stable Postop Assessment: no apparent nausea or vomiting Anesthetic complications: no     Last Vitals:  Vitals:   05/10/19 0949 05/10/19 1004  BP:  127/68  Pulse: 73 78  Resp: 15 16  Temp: (!) 36.2 C 36.6 C  SpO2: 97% 96%    Last Pain:  Vitals:   05/10/19 1004  TempSrc: Temporal  PainSc: 3                  Martha Clan

## 2019-05-10 NOTE — H&P (Signed)
No change in clinical history or exam. For cholecystectomy.  

## 2019-05-10 NOTE — Telephone Encounter (Signed)
Pt left vm he states Dr. Marlyn Corporal scheduled him with Dr. Allen Norris to get a stone out he would like to receive a call please

## 2019-05-10 NOTE — Telephone Encounter (Signed)
Spoke with pt and advised him he is scheduled for an ERCP with [facility] on Thursday, June 11th. Pt has been given instructions for procedure.

## 2019-05-10 NOTE — Progress Notes (Unsigned)
re

## 2019-05-10 NOTE — Op Note (Signed)
Preoperative diagnosis: Chronic cholecystitis and cholelithiasis.  Postoperative diagnosis: Same with choledocholithiasis.  Focal area of fibrosis on the inferior aspect of the right lobe of the liver  Operating Surgeon: Hervey Ard, MD.  Anesthesia: General endotracheal.  Estimated blood loss: Less than 5 cc.  Clinical note: This 53 year old male is developed symptomatic cholelithiasis.  Liver function studies are normal.  He is admitted for elective cholecystectomy.  SCD stockings for DVT prevention.  Hair was removed from the abdominal wall with clippers prior to presentation of the operating theater.  Operative note: With the patient under adequate general endotracheal anesthesia the abdomen was cleansed with ChloraPrep and draped.  In Trendelenburg position a varies needle was placed with trans-umbilical incision.  After assuring intra-abdominal location with a hanging drop test the abdomen was insufflated with CO2 at 10 mmHg pressure.  A 10 mm Step port was expanded.  Inspection showed no evidence of injury from initial port placement.  The patient was placed in the reverse Trendelenburg position and rolled to the left.  An 11 mm XL port was placed in the epigastrium.  2-5 mm Ports were then placed in the right lateral abdominal wall under direct vision.  There were some adhesions to the omentum to the neck of the gallbladder.  The gallbladder was placed on cephalad traction.  The adhesions were taken down with cautery dissection.  The cystic duct was cleared.  Fluoroscopic cholangiograms are completed using a total of 35 cc of one half strength Conray 60 making use of a Kumar clamp.  This showed prompt filling the cystic duct and refluxing of the common hepatic duct, free flow into the duodenum but a persistent filling defect in the midportion of the common bile duct consistent with a stone.  Nonobstructive.  The catheter was removed and the cystic duct was doubly clipped and divided.   Branches of the cystic artery were treated in a similar fashion.  The gallbladder was removed from the liver bed making use of hook cautery dissection.  This was then delivered to the umbilical port site.  The site was stretched without division of the fascia.  The thick, crankcase oil like bile was extracted and a single 10 mm stone was crushed to allow extraction of the gallbladder.  After reestablishing pneumoperitoneum the right upper quadrant was irrigated with lactated Ringer solution.  There was an irregular white patchy area on the inferior aspect of the right lobe lateral to the gallbladder fossa.  Photograph obtained.  This was grasped several times with a microdissector and pieces torn off.  Scant bleeding was noted suggestive of fibrosis.  The samples were sent Telfa pad for routine histology.  The area was treated with cautery to minimize the risk of late bleeding.  With the sponge tape and instrument count correct the abdomen was desufflated and ports removed under direct vision.  Skin incisions were closed with 4-0 Vicryl subcuticular sutures.  Benzoin, Steri-Strips, Telfa and Tegaderm dressings were applied.  The patient tolerated the procedure well and was taken recovery in stable condition.

## 2019-05-10 NOTE — Transfer of Care (Signed)
Immediate Anesthesia Transfer of Care Note  Patient: Alejandro Patel  Procedure(s) Performed: LAPAROSCOPIC CHOLECYSTECTOMY WITH INTRAOPERATIVE CHOLANGIOGRAM (N/A )  Patient Location: PACU  Anesthesia Type:General  Level of Consciousness: awake, alert  and oriented  Airway & Oxygen Therapy: Patient Spontanous Breathing and Patient connected to nasal cannula oxygen  Post-op Assessment: Report given to RN and Post -op Vital signs reviewed and stable  Post vital signs: Reviewed and stable  Last Vitals:  Vitals Value Taken Time  BP 108/80 05/10/2019  8:53 AM  Temp 36.1 C 05/10/2019  8:53 AM  Pulse 71 05/10/2019  8:53 AM  Resp 24 05/10/2019  8:53 AM  SpO2 98 % 05/10/2019  8:53 AM  Vitals shown include unvalidated device data.  Last Pain:  Vitals:   05/10/19 0620  TempSrc: Oral  PainSc: 1          Complications: No apparent anesthesia complications

## 2019-05-10 NOTE — Anesthesia Post-op Follow-up Note (Signed)
Anesthesia QCDR form completed.        

## 2019-05-12 ENCOUNTER — Encounter: Payer: Self-pay | Admitting: *Deleted

## 2019-05-13 ENCOUNTER — Ambulatory Visit: Payer: PRIVATE HEALTH INSURANCE

## 2019-05-13 ENCOUNTER — Ambulatory Visit: Payer: PRIVATE HEALTH INSURANCE | Admitting: Anesthesiology

## 2019-05-13 ENCOUNTER — Ambulatory Visit
Admission: RE | Admit: 2019-05-13 | Discharge: 2019-05-13 | Disposition: A | Discharge status: Home / Self Care | Payer: PRIVATE HEALTH INSURANCE | Attending: Gastroenterology | Admitting: Gastroenterology

## 2019-05-13 ENCOUNTER — Encounter: Payer: Self-pay | Admitting: Gastroenterology

## 2019-05-13 ENCOUNTER — Encounter
Admission: RE | Disposition: A | Discharge status: Home / Self Care | Payer: Self-pay | Source: Home / Self Care | Attending: Gastroenterology

## 2019-05-13 ENCOUNTER — Other Ambulatory Visit: Payer: Self-pay

## 2019-05-13 DIAGNOSIS — R932 Abnormal findings on diagnostic imaging of liver and biliary tract: Secondary | ICD-10-CM | POA: Diagnosis not present

## 2019-05-13 DIAGNOSIS — K8050 Calculus of bile duct without cholangitis or cholecystitis without obstruction: Secondary | ICD-10-CM

## 2019-05-13 DIAGNOSIS — Z9049 Acquired absence of other specified parts of digestive tract: Secondary | ICD-10-CM | POA: Diagnosis not present

## 2019-05-13 DIAGNOSIS — Z79899 Other long term (current) drug therapy: Secondary | ICD-10-CM | POA: Diagnosis not present

## 2019-05-13 DIAGNOSIS — F17210 Nicotine dependence, cigarettes, uncomplicated: Secondary | ICD-10-CM | POA: Diagnosis not present

## 2019-05-13 DIAGNOSIS — K219 Gastro-esophageal reflux disease without esophagitis: Secondary | ICD-10-CM | POA: Insufficient documentation

## 2019-05-13 HISTORY — PX: ERCP: SHX5425

## 2019-05-13 LAB — SURGICAL PATHOLOGY

## 2019-05-13 SURGERY — ERCP, WITH INTERVENTION IF INDICATED
Anesthesia: General

## 2019-05-13 MED ORDER — PROPOFOL 500 MG/50ML IV EMUL
INTRAVENOUS | Status: AC
  Filled 2019-05-13: qty 50

## 2019-05-13 MED ORDER — LACTATED RINGERS IV SOLN
INTRAVENOUS | Status: DC
  Administered 2019-05-13: 10:00:00 via INTRAVENOUS

## 2019-05-13 MED ORDER — FENTANYL CITRATE (PF) 100 MCG/2ML IJ SOLN
INTRAMUSCULAR | Status: AC
  Filled 2019-05-13: qty 2

## 2019-05-13 MED ORDER — INDOMETHACIN 50 MG RE SUPP
RECTAL | Status: AC
  Administered 2019-05-13: 10:00:00 100 mg via RECTAL
  Filled 2019-05-13: qty 2

## 2019-05-13 MED ORDER — PROPOFOL 10 MG/ML IV BOLUS
INTRAVENOUS | Status: AC
  Filled 2019-05-13: qty 20

## 2019-05-13 MED ORDER — FENTANYL CITRATE (PF) 100 MCG/2ML IJ SOLN
INTRAMUSCULAR | Status: DC | PRN
  Administered 2019-05-13: 25 ug via INTRAVENOUS

## 2019-05-13 MED ORDER — INDOMETHACIN 50 MG RE SUPP
100.0000 mg | Freq: Once | RECTAL | Status: AC
  Administered 2019-05-13: 100 mg via RECTAL

## 2019-05-13 MED ORDER — SODIUM CHLORIDE 0.9 % IV SOLN: INTRAVENOUS | Status: DC

## 2019-05-13 MED ORDER — PROPOFOL 500 MG/50ML IV EMUL
INTRAVENOUS | Status: DC | PRN
  Administered 2019-05-13: 145 ug/kg/min via INTRAVENOUS

## 2019-05-13 NOTE — Anesthesia Postprocedure Evaluation (Signed)
Anesthesia Post Note  Patient: Alejandro Patel  Procedure(s) Performed: ENDOSCOPIC RETROGRADE CHOLANGIOPANCREATOGRAPHY (ERCP) (N/A )  Patient location during evaluation: Endoscopy Anesthesia Type: General Level of consciousness: awake and alert Pain management: pain level controlled Vital Signs Assessment: post-procedure vital signs reviewed and stable Respiratory status: spontaneous breathing, nonlabored ventilation and respiratory function stable Cardiovascular status: blood pressure returned to baseline and stable Postop Assessment: no apparent nausea or vomiting Anesthetic complications: no     Last Vitals:  Vitals:   05/13/19 1033 05/13/19 1043  BP: (!) 174/110 (!) 183/115  Pulse: 68   Resp: (!) 22   Temp: (!) 36.3 C   SpO2: 99%     Last Pain:  Vitals:   05/13/19 1053  TempSrc:   PainSc: 2                  Alphonsus Sias

## 2019-05-13 NOTE — Op Note (Signed)
[facility] Gastroenterology Patient Name: Alejandro MassedRobert Patel Procedure Date: 05/13/2019 9:43 AM MRN: [id number] Account #: [id number] Date of Birth: 01-27-1966 Admit Type: Outpatient Age: [age] [facility] RO[facility] 4 Gender: Male Note Status: Finalized Procedure:            ERCP Indications:          Common bile duct stone(s), Filling defect on                        intraoperative cholangiogram Providers:            Midge Miniumarren Laniqua Torrens MD, MD Medicines:            Propofol per Anesthesia Complications:        No immediate complications. Procedure:            Pre-Anesthesia Assessment:                       - Prior to the procedure, a History and Physical was                        performed, and patient medications and allergies were                        reviewed. The patient's tolerance of previous                        anesthesia was also reviewed. The risks and benefits of                        the procedure and the sedation options and risks were                        discussed with the patient. All questions were                        answered, and informed consent was obtained. Prior                        Anticoagulants: The patient has taken no previous                        anticoagulant or antiplatelet agents. ASA Grade                        Assessment: II - A patient with mild systemic disease.                        After reviewing the risks and benefits, the patient was                        deemed in satisfactory condition to undergo the                        procedure.                       After obtaining informed consent, the scope was passed                        under direct vision. Throughout the procedure,  the                        patient's blood pressure, pulse, and oxygen saturations                        were monitored continuously. The Duodenoscope was                        introduced through the mouth, and used to inject          contrast into and used to inject contrast into the bile                        duct. The ERCP was accomplished without difficulty. The                        patient tolerated the procedure well. Findings:      A scout film of the abdomen was obtained. Surgical clips, consistent       with a previous cholecystectomy, were seen in the area of the right       upper quadrant of the abdomen. The major papilla was normal. The bile       duct was deeply cannulated with the short-nosed traction sphincterotome.       Contrast was injected. I personally interpreted the bile duct images.       There was brisk flow of contrast through the ducts. Image quality was       excellent. Contrast extended to the entire biliary tree. The lower third       of the main bile duct contained one stone, which was 4 mm in diameter. A       wire was passed into the biliary tree. A 6 mm biliary sphincterotomy was       made with a traction (standard) sphincterotome using ERBE       electrocautery. There was no post-sphincterotomy bleeding. The biliary       tree was swept with a 15 mm balloon starting at the bifurcation. One       stone was removed. No stones remained. Impression:           - The major papilla appeared normal.                       - Choledocholithiasis was found. Complete removal was                        accomplished by biliary sphincterotomy and balloon                        extraction.                       - A biliary sphincterotomy was performed.                       - The biliary tree was swept. Recommendation:       - Discharge patient to home.                       - Clear liquid diet today.                       -  Continue present medications.                       - Watch for pancreatitis, bleeding, perforation, and                        cholangitis. Procedure Code(s):    --- Professional ---                       [phone], Endoscopic retrograde cholangiopancreatography                         (ERCP); with removal of calculi/debris from                        biliary/pancreatic duct(s)                       [id number], Endoscopic retrograde cholangiopancreatography                        (ERCP); with sphincterotomy/papillotomy                       [phone], Endoscopic catheterization of the biliary ductal                        system, radiological supervision and interpretation Diagnosis Code(s):    --- Professional ---                       K80.50, Calculus of bile duct without cholangitis or                        cholecystitis without obstruction                       R93.2, Abnormal findings on diagnostic imaging of liver                        and biliary tract CPT copyright 2019 American Medical Association. All rights reserved. The codes documented in this report are preliminary and upon coder review may  be revised to meet current compliance requirements. Lucilla Lame MD, MD 05/13/2019 10:33:20 AM This report has been signed electronically. Number of Addenda: 0 Note Initiated On: 05/13/2019 9:43 AM Estimated Blood Loss: Estimated blood loss: none.      [facility]

## 2019-05-13 NOTE — Anesthesia Preprocedure Evaluation (Addendum)
Anesthesia Evaluation  Patient identified by MRN, date of birth, ID band Patient awake    Reviewed: Allergy & Precautions, H&P , NPO status , reviewed documented beta blocker date and time   Airway Mallampati: II  TM Distance: >3 FB Neck ROM: full    Dental  (+) Missing, Chipped   Pulmonary Current Smoker,    Pulmonary exam normal        Cardiovascular Normal cardiovascular exam     Neuro/Psych    GI/Hepatic GERD  ,  Endo/Other    Renal/GU      Musculoskeletal   Abdominal   Peds  Hematology   Anesthesia Other Findings Past Medical History: No date: GERD (gastroesophageal reflux disease)  Past Surgical History: 05/10/2019: CHOLECYSTECTOMY; N/A     Comment:  Procedure: LAPAROSCOPIC CHOLECYSTECTOMY WITH               INTRAOPERATIVE CHOLANGIOGRAM;  Surgeon: Min Bellow, MD;  Location: [facility] ORS;  Service: General;                Laterality: N/A; No date: COLONOSCOPY     Reproductive/Obstetrics                            Anesthesia Physical Anesthesia Plan  ASA: II  Anesthesia Plan: General   Post-op Pain Management:    Induction: Intravenous  PONV Risk Score and Plan: 1 and Treatment may vary due to age or medical condition and TIVA  Airway Management Planned: Nasal Cannula, Natural Airway and Oral ETT  Additional Equipment:   Intra-op Plan:   Post-operative Plan:   Informed Consent: I have reviewed the patients History and Physical, chart, labs and discussed the procedure including the risks, benefits and alternatives for the proposed anesthesia with the patient or authorized representative who has indicated his/her understanding and acceptance.     Dental Advisory Given  Plan Discussed with: CRNA  Anesthesia Plan Comments:         Anesthesia Quick Evaluation

## 2019-05-13 NOTE — Anesthesia Post-op Follow-up Note (Signed)
Anesthesia QCDR form completed.        

## 2019-05-13 NOTE — Transfer of Care (Signed)
Immediate Anesthesia Transfer of Care Note  Patient: Alejandro Patel  Procedure(s) Performed: ENDOSCOPIC RETROGRADE CHOLANGIOPANCREATOGRAPHY (ERCP) (N/A )  Patient Location: PACU  Anesthesia Type:General  Level of Consciousness: awake and drowsy  Airway & Oxygen Therapy: Patient Spontanous Breathing and Patient connected to nasal cannula oxygen  Post-op Assessment: Report given to RN and Post -op Vital signs reviewed and stable  Post vital signs: Reviewed and stable  Last Vitals:  Vitals Value Taken Time  BP 177/111 05/13/19 1035  Temp 36.3 C 05/13/19 1033  Pulse 68 05/13/19 1038  Resp 24 05/13/19 1038  SpO2 98 % 05/13/19 1038  Vitals shown include unvalidated device data.  Last Pain:  Vitals:   05/13/19 1033  TempSrc: Tympanic  PainSc: Asleep         Complications: No apparent anesthesia complications

## 2019-05-13 NOTE — Interval H&P Note (Signed)
History and Physical Interval Note:  05/13/2019 10:27 AM  Alejandro Patel  has presented today for surgery, with the diagnosis of Common bile duct stone K80.50.  The various methods of treatment have been discussed with the patient and family. After consideration of risks, benefits and other options for treatment, the patient has consented to  Procedure(s): ENDOSCOPIC RETROGRADE CHOLANGIOPANCREATOGRAPHY (ERCP) (N/A) as a surgical intervention.  The patient's history has been reviewed, patient examined, no change in status, stable for surgery.  I have reviewed the patient's chart and labs.  Questions were answered to the patient's satisfaction.     Daisi Kentner [employer]

## 2019-05-14 ENCOUNTER — Telehealth: Payer: Self-pay | Admitting: General Surgery

## 2019-05-14 NOTE — Telephone Encounter (Signed)
Patient is calling asking if he is able to start showering now. Please call patient and advise.

## 2019-05-14 NOTE — Telephone Encounter (Signed)
Patient may shower.  °

## 2019-05-17 ENCOUNTER — Other Ambulatory Visit: Payer: Self-pay | Admitting: General Surgery

## 2019-05-19 ENCOUNTER — Encounter: Payer: PRIVATE HEALTH INSURANCE | Admitting: Surgery

## 2019-05-20 ENCOUNTER — Other Ambulatory Visit: Payer: Self-pay

## 2019-05-20 ENCOUNTER — Encounter: Payer: Self-pay | Admitting: General Surgery

## 2019-05-20 ENCOUNTER — Ambulatory Visit (INDEPENDENT_AMBULATORY_CARE_PROVIDER_SITE_OTHER): Payer: PRIVATE HEALTH INSURANCE | Admitting: General Surgery

## 2019-05-20 VITALS — BP 147/95 | HR 79 | Temp 97.7°F | Ht 67.0 in | Wt 173.6 lb

## 2019-05-20 DIAGNOSIS — K8020 Calculus of gallbladder without cholecystitis without obstruction: Secondary | ICD-10-CM

## 2019-05-20 NOTE — Patient Instructions (Signed)
Your steri strips will fall off on their own. You may take them off when they curl up at both ends.   Follow up as needed.

## 2019-05-20 NOTE — Progress Notes (Signed)
Patient ID: Alejandro Patel, male   DOB: [birth year], 53 y.o.   MRN: [id number]  Chief Complaint  Patient presents with  . Routine Post Op    HPI Alejandro Patel is a 53 y.o. male here for a post op check from a laparoscopic cholecystectomy done on 05/10/19. He reports that he is doing well. He reports no problems with eating or using the bathroom.  The patient reports he had a burning sensation in the right upper quadrant for the first 2 days after his ERCP completed on May 13, 2019.  This has completely resolved and he is now asymptomatic.  Tolerated pizza for dinner last night without difficulty.  HPI  Past Medical History:  Diagnosis Date  . GERD (gastroesophageal reflux disease)     Past Surgical History:  Procedure Laterality Date  . CHOLECYSTECTOMY N/A 05/10/2019   Procedure: LAPAROSCOPIC CHOLECYSTECTOMY WITH INTRAOPERATIVE CHOLANGIOGRAM;  Surgeon: Mickael Bellow, MD;  Location: [facility] ORS;  Service: General;  Laterality: N/A;  . COLONOSCOPY    . ERCP N/A 05/13/2019   Procedure: ENDOSCOPIC RETROGRADE CHOLANGIOPANCREATOGRAPHY (ERCP);  Surgeon: Lucilla Lame, MD;  Location: [facility] ENDOSCOPY;  Service: Endoscopy;  Laterality: N/A;    No family history on file.  Social History Social History   Tobacco Use  . Smoking status: Current Some Day Smoker    Packs/day: 0.25    Types: Cigarettes  . Smokeless tobacco: Former Systems developer    Quit date: 05/03/1989  Substance Use Topics  . Alcohol use: Not Currently  . Drug use: Never    Allergies  Allergen Reactions  . Codeine Hives and Itching    Current Outpatient Medications  Medication Sig Dispense Refill  . acetaminophen (TYLENOL) 500 MG tablet Take 1,000 mg by mouth every 8 (eight) hours as needed (pain).    . Calcium-Magnesium-Zinc (CAL-MAG-ZINC PO) Take 1 tablet by mouth daily.    . famotidine (PEPCID) 40 MG tablet Take 40 mg by mouth every evening.     . hyoscyamine (LEVSIN SL) 0.125 MG SL tablet Place 1 tablet (0.125 mg total) under  the tongue every 4 (four) hours as needed. 10 tablet 0  . pantoprazole (PROTONIX) 40 MG tablet Take 40 mg by mouth daily as needed (acid reflux).     . traMADol (ULTRAM) 50 MG tablet Take 1 tablet (50 mg total) by mouth every 4 (four) hours as needed. 30 tablet 0   No current facility-administered medications for this visit.     Review of Systems Review of Systems  Constitutional: Negative.   Respiratory: Negative.   Cardiovascular: Negative.     Blood pressure (!) 147/95, pulse 79, temperature 97.7 F (36.5 C), height 5\' 7"  (1.702 m), weight 173 lb 9.6 oz (78.7 kg), SpO2 97 %.  Physical Exam Physical Exam Constitutional:      Appearance: He is well-developed.  Eyes:     General: No scleral icterus.    Conjunctiva/sclera: Conjunctivae normal.  Neck:     Musculoskeletal: Neck supple.  Cardiovascular:     Rate and Rhythm: Normal rate and regular rhythm.     Heart sounds: Normal heart sounds.  Pulmonary:     Effort: Pulmonary effort is normal.     Breath sounds: Normal breath sounds.  Abdominal:     General: Abdomen is flat.     Palpations: Abdomen is soft.    Lymphadenopathy:     Cervical: No cervical adenopathy.  Skin:    General: Skin is warm and dry.  Neurological:  Mental Status: He is alert and oriented to person, place, and time.     Data Reviewed A. GALLBLADDER; CHOLECYSTECTOMY:  - XANTHOMATOUS, EARLY ACUTE, AND CHRONIC CHOLECYSTITIS WITH  CHOLELITHIASIS.  - CHOLESTEROLOSIS.  - POLYPOID PYLORIC GLAND METAPLAIA WITH REACTIVE EPITHELIAL ATYPIA.  - NEGATIVE FOR DYSPLASIA AND MALIGNANCY.   B. LIVER, RIGHT LOBE; BIOPSY:  - FRAGMENTS OF BENIGN FIBROUS TISSUE WITH FOCAL AREAS OF HEPATIC  PARENCYMA WITH STEATOSIS.  - IRON DEPOSITION AND INTRAHEPATOCYTE PASD GLOBULES ARE NOT IDENTIFIED.  - TRICHROME STAIN HIGHLIGHTS FIBROUS TISSUE.  - NEGATIVE FOR MALIGNANCY.   ERCP showed a 4 mm stone in the common bile duct, removed.  Assessment Doing well post  cholecystectomy and ERCP for CBD stone.  Plan  Proper lifting technique reviewed.    Follow up as needed.  HPI, Physical Exam, Assessment and Plan have been scribed under the direction and in the presence of Earline MayotteJeffrey W. Serinity Ware, MD  Carron Brazenaryl-Lyn Kennedy, LPN  I have completed the exam and reviewed the above documentation for accuracy and completeness.  I agree with the above.  [employer]useum[employer]gallery [employer] has been used and any errors in dictation or transcription are unintentional.  Donnalee CurryJeffrey Brigitt Mcclish, M.D., F.A.C.S.  Merrily PewJeffrey W Jerald Villalona 05/20/2019, 10:39 AM

## 2019-10-02 ENCOUNTER — Ambulatory Visit: Admit: 2019-10-02 | Payer: PRIVATE HEALTH INSURANCE | Admitting: Surgery

## 2019-10-02 SURGERY — LAPAROSCOPIC CHOLECYSTECTOMY
Anesthesia: General

## 2020-03-09 ENCOUNTER — Other Ambulatory Visit
Admission: RE | Admit: 2020-03-09 | Discharge: 2020-03-09 | Disposition: A | Discharge status: Home / Self Care | Payer: 59 | Source: Ambulatory Visit | Attending: General Surgery | Admitting: General Surgery

## 2020-03-09 DIAGNOSIS — Z20822 Contact with and (suspected) exposure to covid-19: Secondary | ICD-10-CM | POA: Insufficient documentation

## 2020-03-09 DIAGNOSIS — Z01812 Encounter for preprocedural laboratory examination: Secondary | ICD-10-CM | POA: Diagnosis present

## 2020-03-09 LAB — SARS CORONAVIRUS 2 (TAT 6-24 HRS): SARS Coronavirus 2: NEGATIVE

## 2020-11-16 ENCOUNTER — Other Ambulatory Visit: Payer: Self-pay

## 2020-11-16 ENCOUNTER — Encounter: Payer: Self-pay | Admitting: Family Medicine

## 2020-11-16 ENCOUNTER — Ambulatory Visit (INDEPENDENT_AMBULATORY_CARE_PROVIDER_SITE_OTHER): Payer: 59 | Admitting: Family Medicine

## 2020-11-16 DIAGNOSIS — S134XXA Sprain of ligaments of cervical spine, initial encounter: Secondary | ICD-10-CM | POA: Diagnosis not present

## 2020-11-16 MED ORDER — GABAPENTIN 100 MG PO CAPS: 200.0000 mg | ORAL_CAPSULE | Freq: Every day | ORAL | 3 refills | Status: AC
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
## 2020-11-16 NOTE — Patient Instructions (Signed)
100 mg gabapentin at night Continue vitamins Voltaren gel  Exercise 3 times a week See me again in 4 weeks

## 2020-11-16 NOTE — Assessment & Plan Note (Addendum)
Patient has sigs of whiplash with some mild cervical radiculopathy.  No significant weakness noted of the arm.  Patient does have some limitation in extension of the neck as well as sidebending.  Patient did have x-rays at an outside facility and we will try to get them.  Patient has been told that he does have some arthritic changes.  Started on low-dose of gabapentin, given home exercises.  Patient was given prednisone initially which made some mild improvement.  Discussed with patient that if any worsening symptoms such as weakness of the arm, worsening neck pain, headaches to seek medical attention immediately.  Patient will be following up with me again in 3 to 4 weeks.  Could consider the possibility of manipulation if radicular symptoms improved worsening symptoms need ot consider PT and advance imaging.

## 2020-12-20 ENCOUNTER — Ambulatory Visit: Payer: 59 | Admitting: Family Medicine

## 2021-01-03 NOTE — Progress Notes (Unsigned)
[facility] [address] [place] [address] Phone: [phone] Subjective:   I Alejandro Patel am serving as a Neurosurgeon for Dr. Antoine Primas.  This visit occurred during the SARS-CoV-2 public health emergency.  Safety protocols were in place, including screening questions prior to the visit, additional usage of staff PPE, and extensive cleaning of exam room while observing appropriate contact time as indicated for disinfecting solutions.   I'm seeing this patient by the request  of:  Danella Penton, MD  CC: Neck pain follow-up  [id number]   11/16/2020 Patient has sigs of whiplash with some mild cervical radiculopathy.  No significant weakness noted of the arm.  Patient does have some limitation in extension of the neck as well as sidebending.  Patient did have x-rays at an outside facility and we will try to get them.  Patient has been told that he does have some arthritic changes.  Started on low-dose of gabapentin, given home exercises.  Patient was given prednisone initially which made some mild improvement.  Discussed with patient that if any worsening symptoms such as weakness of the arm, worsening neck pain, headaches to seek medical attention immediately.  Patient will be following up with me again in 3 to 4 weeks.  Could consider the possibility of manipulation if radicular symptoms improved worsening symptoms need ot consider PT and advance imaging.    Update 01/04/2021 Alejandro Patel is a 55 y.o. male coming in with complaint of cervical spine pain. Patient states his neck is ok. Still having some issues but has improved.  Patient states approximately 30 to 40% improvement.  Still has more tightness.  Not taking anything for pain at this time. Patient was given a prescription for the gabapentin but not taking it regularly.      Past Medical History:  Diagnosis Date  . GERD (gastroesophageal reflux disease)    Past Surgical History:   Procedure Laterality Date  . CHOLECYSTECTOMY N/A 05/10/2019   Procedure: LAPAROSCOPIC CHOLECYSTECTOMY WITH INTRAOPERATIVE CHOLANGIOGRAM;  Surgeon: Earline Mayotte, MD;  Location: [facility] ORS;  Service: General;  Laterality: N/A;  . COLONOSCOPY    . ERCP N/A 05/13/2019   Procedure: ENDOSCOPIC RETROGRADE CHOLANGIOPANCREATOGRAPHY (ERCP);  Surgeon: Midge Minium, MD;  Location[facility] ENDOSCOPY;  Service: Endoscopy;  Laterality: N/A;   Social History   Socioeconomic History  . Marital status: Single    Spouse name: Not on file  . Number of children: Not on file  . Years of education: Not on file  . Highest education level: Not on file  Occupational History  . Not on file  Tobacco Use  . Smoking status: Current Some Day Smoker    Packs/day: 0.25    Types: Cigarettes  . Smokeless tobacco: Former Neurosurgeon    Quit date: 05/03/1989  Vaping Use  . Vaping Use: Never used  Substance and Sexual Activity  . Alcohol use: Not Currently  . Drug use: Never  . Sexual activity: Not on file  Other Topics Concern  . Not on file  Social History Narrative  . Not on file   Social Determinants of Health   Financial Resource Strain: Not on file  Food Insecurity: Not on file  Transportation Needs: Not on file  Physical Activity: Not on file  Stress: Not on file  Social Connections: Not on file   Allergies  Allergen Reactions  . Codeine Hives and Itching   No family history on file.     Current Outpatient  Medications (Analgesics):  .  acetaminophen (TYLENOL) 500 MG tablet, Take 1,000 mg by mouth every 8 (eight) hours as needed (pain).   Current Outpatient Medications (Other):  Marland Kitchen  Calcium-Magnesium-Zinc (CAL-MAG-ZINC PO), Take 1 tablet by mouth daily. .  famotidine (PEPCID) 40 MG tablet, Take 40 mg by mouth every evening.  .  gabapentin (NEURONTIN) 100 MG capsule, Take 2 capsules (200 mg total) by mouth at bedtime. .  hyoscyamine (LEVSIN SL) 0.125 MG SL tablet, Place 1 tablet (0.125 mg total)  under the tongue every 4 (four) hours as needed. .  pantoprazole (PROTONIX) 40 MG tablet, Take 40 mg by mouth daily as needed (acid reflux).    Reviewed prior external information including notes and imaging from  primary care provider As well as notes that were available from care everywhere and other healthcare systems.  Past medical history, social, surgical and family history all reviewed in electronic medical record.  No pertanent information unless stated regarding to the chief complaint.   Review of Systems:  No headache, visual changes, nausea, vomiting, diarrhea, constipation, dizziness, abdominal pain, skin rash, fevers, chills, night sweats, weight loss, swollen lymph nodes, body aches, joint swelling, chest pain, shortness of breath, mood changes. POSITIVE muscle aches  Objective  Blood pressure (!) 150/90, pulse 78, height 5\' 7"  (1.702 m), weight 180 lb (81.6 kg), SpO2 97 %.   General: No apparent distress alert and oriented x3 mood and affect normal, dressed appropriately.  HEENT: Pupils equal, extraocular movements intact  Respiratory: Patient's speak in full sentences and does not appear short of breath  Cardiovascular: No lower extremity edema, non tender, no erythema  Gait normal with good balance and coordination.  MSK: Neck exam shows the patient does have some mild loss of lordosis but improvement in range of motion from previous exam.  Still some mild tightness with right-sided sidebending on the left side of the neck.  Negative Spurling's today.  5-5 strength of the upper extremities.  Osteopathic findings C4 flexed rotated and side bent left     Impression and Recommendations:     The above documentation has been reviewed and is accurate and complete , DO

## 2021-01-04 ENCOUNTER — Ambulatory Visit (INDEPENDENT_AMBULATORY_CARE_PROVIDER_SITE_OTHER): Payer: 59 | Admitting: Family Medicine

## 2021-01-04 ENCOUNTER — Ambulatory Visit (INDEPENDENT_AMBULATORY_CARE_PROVIDER_SITE_OTHER): Payer: 59

## 2021-01-04 ENCOUNTER — Encounter: Payer: Self-pay | Admitting: Family Medicine

## 2021-01-04 ENCOUNTER — Other Ambulatory Visit: Payer: Self-pay

## 2021-01-04 VITALS — BP 150/90 | HR 78 | Ht 67.0 in | Wt 180.0 lb

## 2021-01-04 DIAGNOSIS — M999 Biomechanical lesion, unspecified: Secondary | ICD-10-CM | POA: Diagnosis not present

## 2021-01-04 DIAGNOSIS — M542 Cervicalgia: Secondary | ICD-10-CM

## 2021-01-04 DIAGNOSIS — G8929 Other chronic pain: Secondary | ICD-10-CM

## 2021-01-04 DIAGNOSIS — S134XXD Sprain of ligaments of cervical spine, subsequent encounter: Secondary | ICD-10-CM

## 2021-01-04 NOTE — Assessment & Plan Note (Signed)
   Decision today to treat with OMT was based on Physical Exam  After verbal consent patient was treated with  ME,  techniques in cervical,areas,  Patient tolerated the procedure well with improvement in symptoms  Patient given exercises, stretches and lifestyle modifications  See medications in patient instructions if given  Patient will follow up in 4-8 weeks

## 2021-01-04 NOTE — Patient Instructions (Addendum)
Good to see you Neck xray today  If any weakness or and radicular symptoms down the arm I would like MRI See me again in 4-6 weeks

## 2021-01-04 NOTE — Assessment & Plan Note (Signed)
Patient has made about 30 to 40% improvement.  Radicular symptoms has improved but continues to have some very mild ones.  Patient has not taken the gabapentin regularly.  Given trial of topical anti-inflammatories.  We will get x-rays at this time.  Worsening symptoms I would still consider the possibility of an MRI.  Patient has declined PT and attempted some very small muscle energy to the neck today with some mild improvement.  Follow-up with me again in 4 to 6 weeks

## 2021-02-07 ENCOUNTER — Ambulatory Visit: Payer: 59 | Admitting: Family Medicine

## 2021-07-12 IMAGING — DX DG CERVICAL SPINE WITH FLEX & EXTEND
7 series · 7 of 7 positions shown · non-contrast
Comparison: None.

CLINICAL DATA: Neck pain, stiffness

EXAM:
CERVICAL SPINE COMPLETE WITH FLEXION AND EXTENSION VIEWS

[c-spine ap]
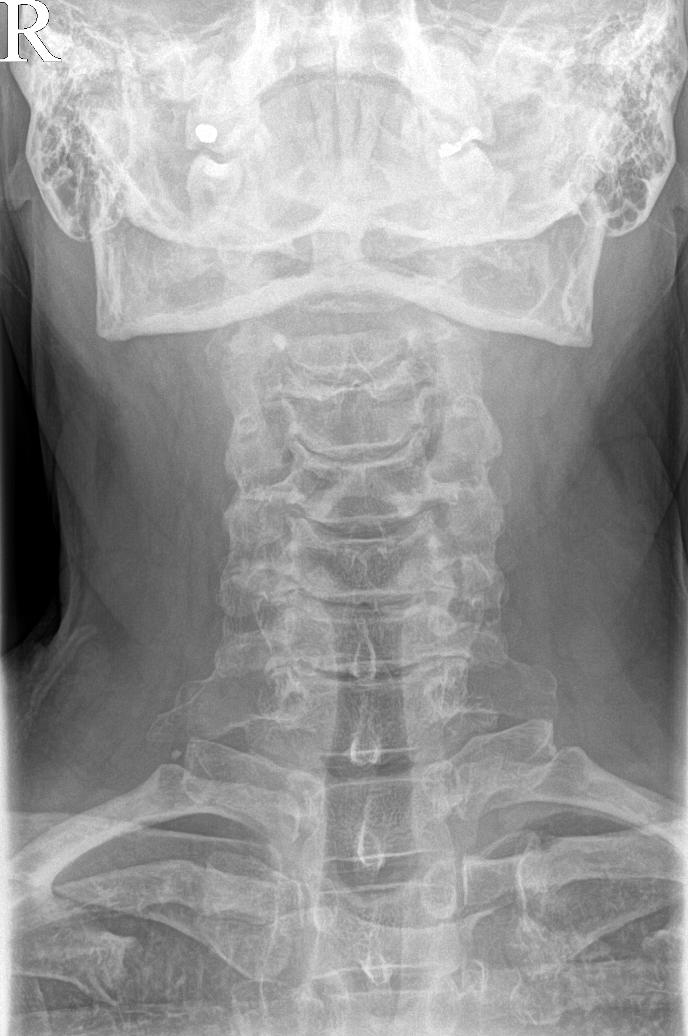

[c-spine obl (1 of 2)]
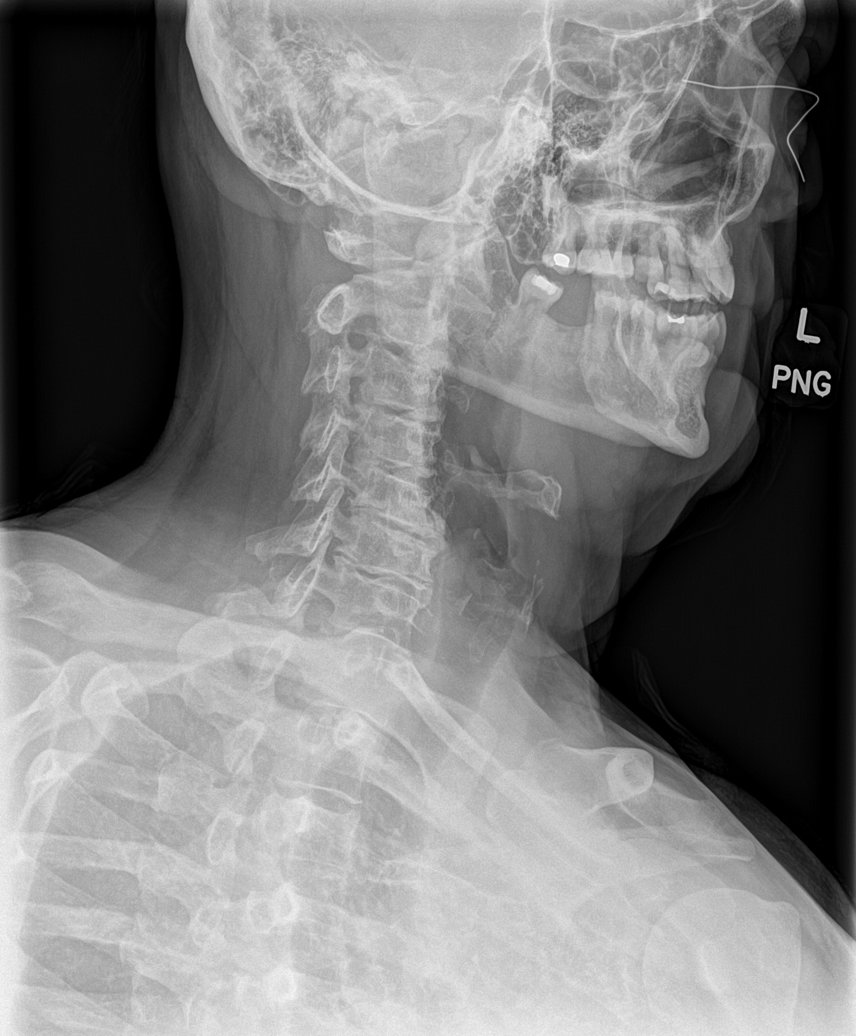

[c-spine obl (2 of 2)]
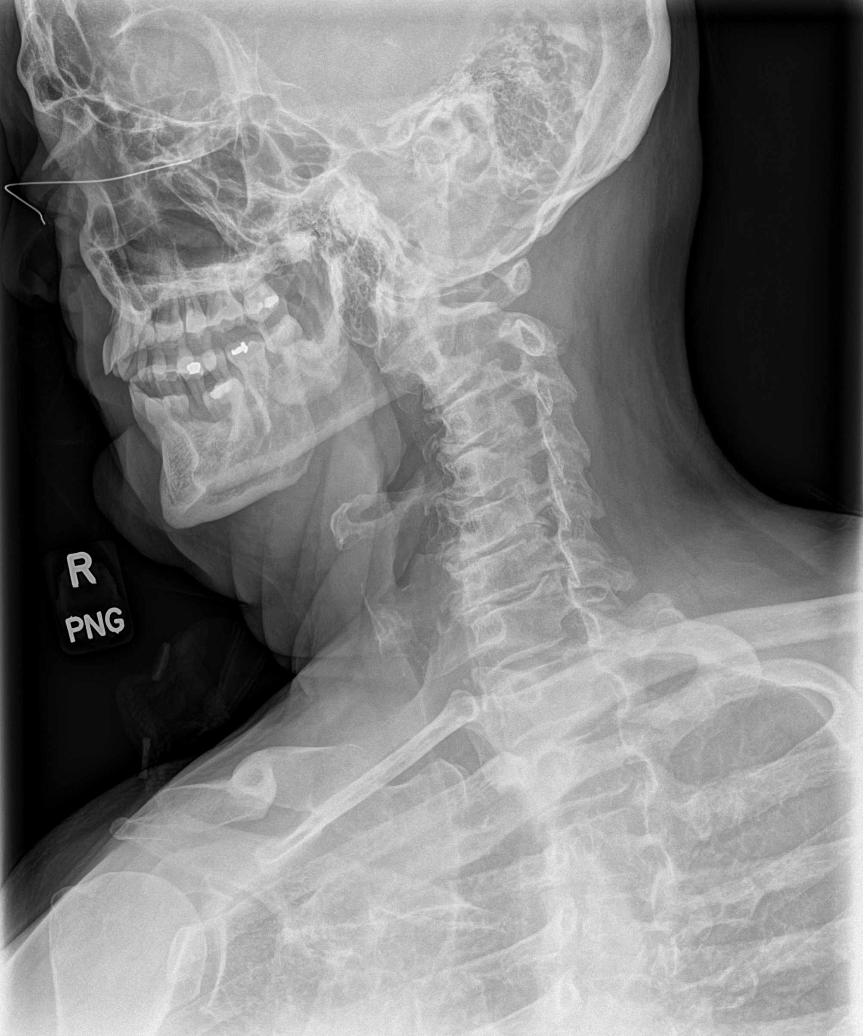

[c-spine lat]
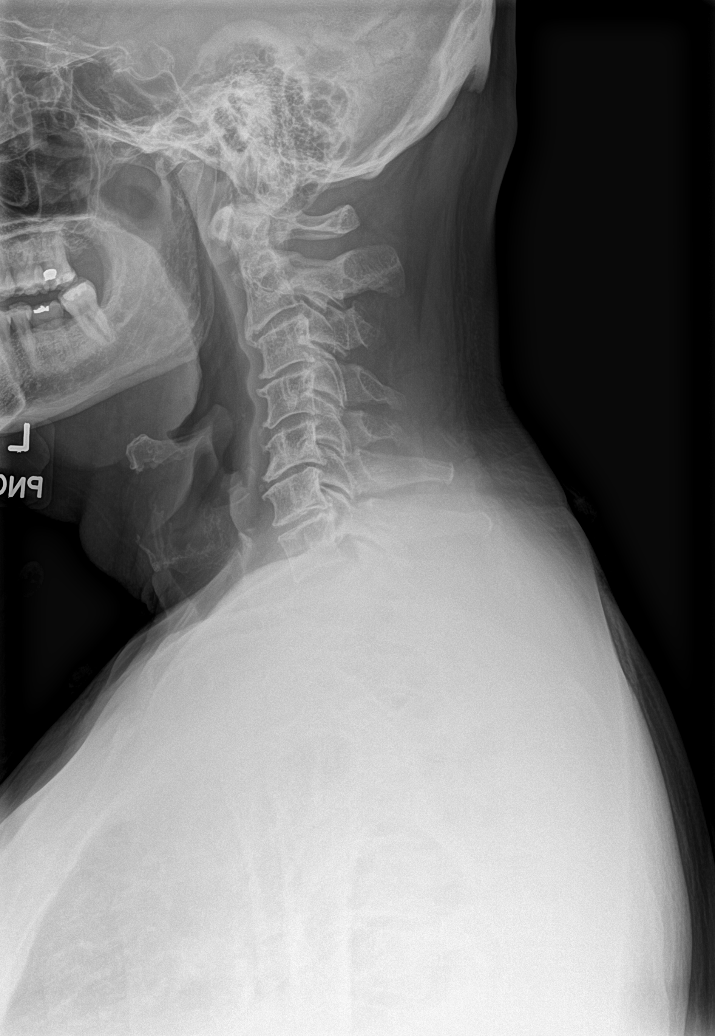

[c-spine flex]
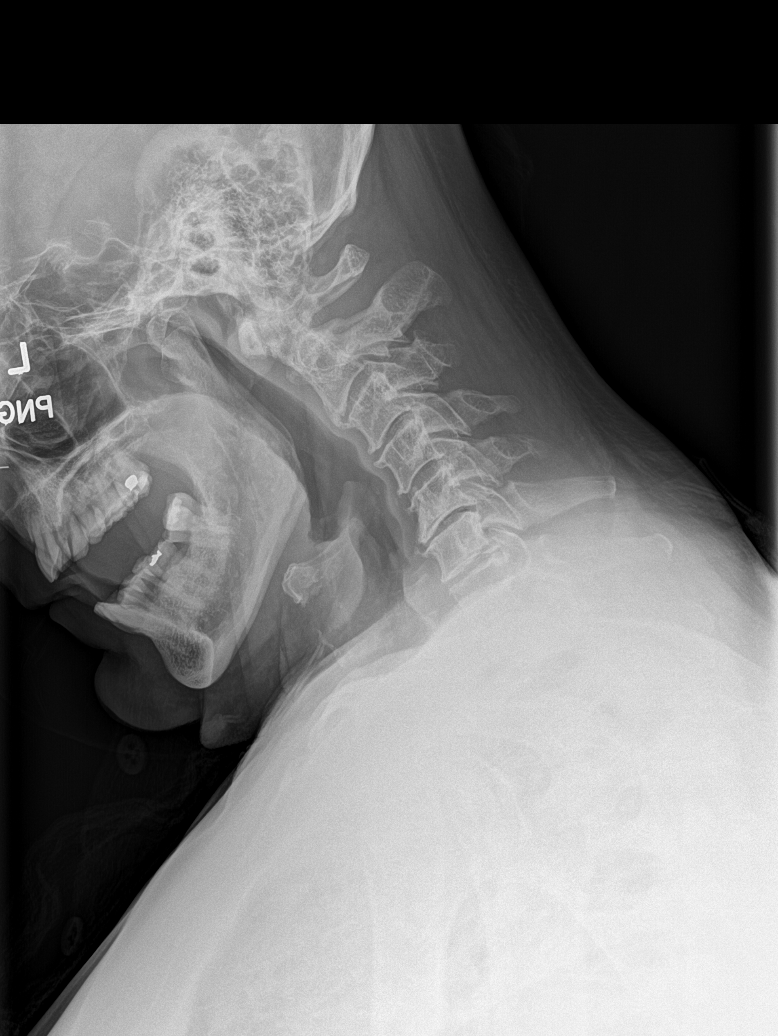

[c-spine ext]
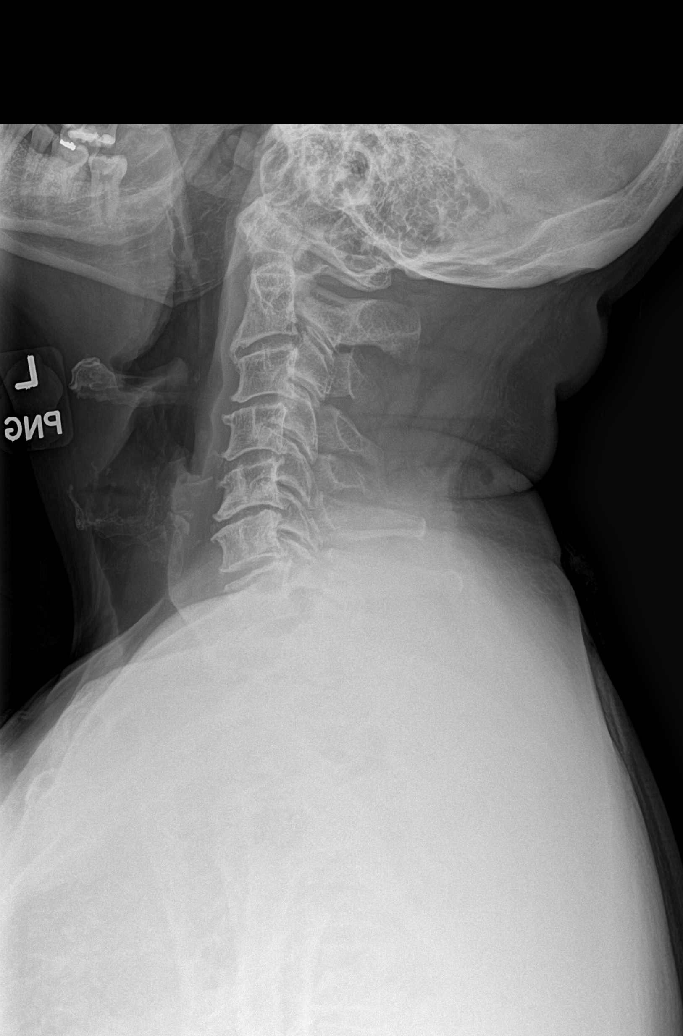

[c-spine open mouth]
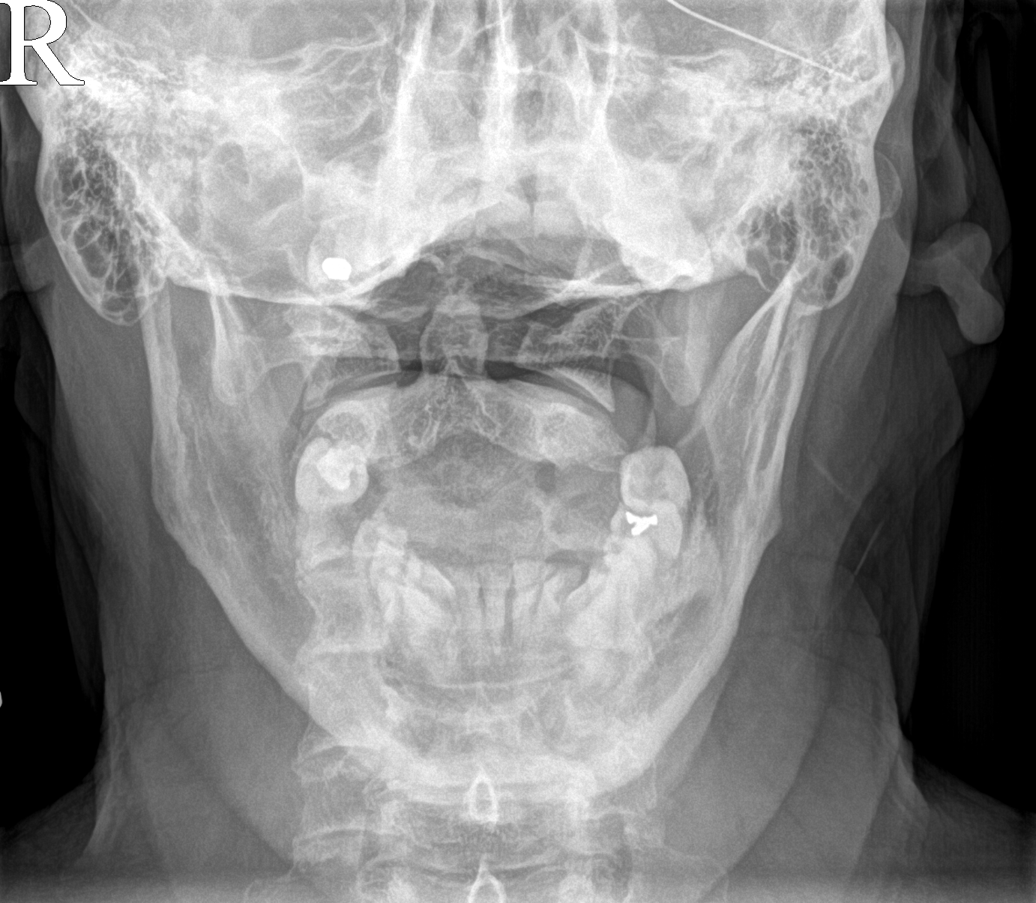

[7 of 7 positions shown; findings below may reference images not displayed]

FINDINGS: Diffuse degenerative disc and facet disease. Left neural foraminal
narrowing at C5-6 and C6-7. Right neural foraminal narrowing from
C3-4 through C6-7. No subluxation. No fracture. Prevertebral soft
tissues are normal.
IMPRESSION: Diffuse degenerative disc and facet disease as above. No acute bony
abnormality.

## 2022-02-27 DIAGNOSIS — J40 Bronchitis, not specified as acute or chronic: Secondary | ICD-10-CM | POA: Diagnosis not present

## 2022-02-27 DIAGNOSIS — R509 Fever, unspecified: Secondary | ICD-10-CM | POA: Diagnosis not present

## 2022-02-27 DIAGNOSIS — J45902 Unspecified asthma with status asthmaticus: Secondary | ICD-10-CM | POA: Diagnosis not present

## 2022-03-29 DIAGNOSIS — R630 Anorexia: Secondary | ICD-10-CM | POA: Diagnosis not present

## 2022-03-29 DIAGNOSIS — Z03818 Encounter for observation for suspected exposure to other biological agents ruled out: Secondary | ICD-10-CM | POA: Diagnosis not present

## 2022-03-29 DIAGNOSIS — R051 Acute cough: Secondary | ICD-10-CM | POA: Diagnosis not present

## 2022-03-29 DIAGNOSIS — R6883 Chills (without fever): Secondary | ICD-10-CM | POA: Diagnosis not present

## 2023-12-10 ENCOUNTER — Ambulatory Visit: Payer: Self-pay | Admitting: Sports Medicine

## 2023-12-10 VITALS — BP 134/78 | HR 78 | Ht 67.0 in | Wt 198.0 lb

## 2023-12-10 DIAGNOSIS — M542 Cervicalgia: Secondary | ICD-10-CM

## 2023-12-10 DIAGNOSIS — M5010 Cervical disc disorder with radiculopathy, unspecified cervical region: Secondary | ICD-10-CM | POA: Diagnosis not present

## 2023-12-10 DIAGNOSIS — M5030 Other cervical disc degeneration, unspecified cervical region: Secondary | ICD-10-CM | POA: Diagnosis not present

## 2023-12-10 MED ORDER — GABAPENTIN 100 MG PO CAPS: 100.0000 mg | ORAL_CAPSULE | Freq: Two times a day (BID) | ORAL | 0 refills | Status: DC

## 2023-12-10 NOTE — Progress Notes (Signed)
 Amb re               Odis Mace D.[facility] [address] [place] [address] Phone: [phone]   Assessment and Plan:     1. Neck pain  2. DDD (degenerative disc disease), cervical 3. Cervical disc disorder with radiculopathy of cervical region -Chronic with exacerbation, subsequent visit - Consistent with cervical DDD and cervical radiculopathy causing neck pain and left-sided radicular symptoms into left upper extremity - Patient did not see significant benefit with prednisone course or NSAID course, so we will not repeat at this time - Restart gabapentin  200 mg nightly - Start HEP and physical therapy.  Referral sent - Due to failure to improve with >6 weeks of conservative therapy, symptoms frequently >6/10, pain affecting day-to-day activity, degenerative changes seen on prior C-spine imaging, recommend further evaluating with C-spine MRI.  Order placed  15 additional minutes spent for educating Therapeutic Home Exercise Program.  This included exercises focusing on stretching, strengthening, with focus on eccentric aspects.   Long term goals include an improvement in range of motion, strength, endurance as well as avoiding reinjury. Patient's frequency would include in 1-2 times a day, 3-5 times a week for a duration of 6-12 weeks. Proper technique shown and discussed handout in great detail with ATC.  All questions were discussed and answered.      Pertinent previous records reviewed include none  Follow Up: 5 days after MRI to review results and discuss treatment plan.  Could consider OMT versus epidural CSI.  Patient is needle phobic   Subjective:   I, Alejandro Patel, am serving as a neurosurgeon for Doctor Morene Mace  Chief Complaint: neck pain   HPI:   11/16/2020 Patient has sigs of whiplash with some mild cervical radiculopathy.  No significant weakness noted of the arm.  Patient does have some limitation in extension of the neck as well  as sidebending.  Patient did have x-rays at an outside facility and we will try to get them.  Patient has been told that he does have some arthritic changes.  Started on low-dose of gabapentin , given home exercises.  Patient was given prednisone initially which made some mild improvement.  Discussed with patient that if any worsening symptoms such as weakness of the arm, worsening neck pain, headaches to seek medical attention immediately.  Patient will be following up with me again in 3 to 4 weeks.  Could consider the possibility of manipulation if radicular symptoms improved worsening symptoms need ot consider PT and advance imaging.    Update 01/04/2021 Alejandro Patel is a 58 y.o. male coming in with complaint of cervical spine pain. Patient states his neck is ok. Still having some issues but has improved.  Patient states approximately 30 to 40% improvement.  Still has more tightness.  Not taking anything for pain at this time. Patient was given a prescription for the gabapentin  but not taking it regularly.  12/10/23 Patient states that his neck pain has come back. He went to PCP and rx diclofenac and prednisone they helped initially but the pain is back to the same... this was roughly 2-3 weeks ago. He was told that his pain and neuropathy are coming from his neck   Relevant Historical Information: None pertinent  Additional pertinent review of systems negative.   Current Outpatient Medications:    acetaminophen  (TYLENOL ) 500 MG tablet, Take 1,000 mg by mouth every 8 (eight) hours as needed (pain)., Disp: , Rfl:  Calcium-Magnesium-Zinc (CAL-MAG-ZINC PO), Take 1 tablet by mouth daily., Disp: , Rfl:    famotidine (PEPCID) 40 MG tablet, Take 40 mg by mouth every evening. , Disp: , Rfl:    gabapentin  (NEURONTIN ) 100 MG capsule, Take 2 capsules (200 mg total) by mouth at bedtime., Disp: 180 capsule, Rfl: 3   gabapentin  (NEURONTIN ) 100 MG capsule, Take 1 capsule (100 mg total) by mouth 2 (two) times  daily., Disp: 60 capsule, Rfl: 0   hyoscyamine  (LEVSIN  SL) 0.125 MG SL tablet, Place 1 tablet (0.125 mg total) under the tongue every 4 (four) hours as needed., Disp: 10 tablet, Rfl: 0   pantoprazole (PROTONIX) 40 MG tablet, Take 40 mg by mouth daily as needed (acid reflux). , Disp: , Rfl:    Objective:     Vitals:   12/10/23 1447  BP: 134/78  Pulse: 78  SpO2: 98%  Weight: 198 lb (89.8 kg)  Height: 5' 7 (1.702 m)      Body mass index is 31.01 kg/m.    Physical Exam:    Neck Exam: Cervical Spine- Posture normal Skin- normal, intact  Neuro:  Strength-  Right Left   Deltoid (C5) 5/5 5/5  Bicep/Brachioradialis (C5/6) 5/5  5/5  Wrist Extension (C6) 5/5 5/5  Tricep (C7) 5/5 5/5  Wrist Flexion (C7) 5/5 5/5  Grip (C8) 5/5 5/5  Finger Abduction (T1) 5/5 5/5   Sensation: Sensation decreased over left lateral arm compared to right.  Otherwise intact to light touch in upper extremities bilaterally  Spurling's: Positive left, negative right Neck ROM: Mild reduction in bilateral sidebending and rotation, with more reduced on left compared to right TTP: Left cervical paraspinal, left trapezius NTTP: cervical spinous processes, right cervical paraspinal, thoracic paraspinal, right trapezius    Left shoulder:  No deformity, swelling or muscle wasting No scapular winging FF 180, abd 180, int 0, ext 90 NTTP over the [place], clavicle, ac, coracoid, biceps groove, humerus, deltoid, trapezius, cervical spine Neg neer, hawkins, empty can, obriens, crossarm, subscap liftoff, speeds Neg ant drawer, sulcus sign, apprehension      Electronically signed by:  Odis Mace D.[facility] 4:45 PM 12/10/23

## 2023-12-10 NOTE — Patient Instructions (Signed)
 Gabapentin 200 mg nightly  MRI referral  PT referral  Neck HEP  Follow up 5 days after MRI to discuss results

## 2023-12-29 ENCOUNTER — Ambulatory Visit
Admission: RE | Admit: 2023-12-29 | Discharge: 2023-12-29 | Disposition: A | Discharge status: Home / Self Care | Payer: 59 | Source: Ambulatory Visit | Attending: Sports Medicine | Admitting: Sports Medicine

## 2023-12-29 DIAGNOSIS — M542 Cervicalgia: Secondary | ICD-10-CM

## 2023-12-30 ENCOUNTER — Other Ambulatory Visit: Payer: Self-pay | Admitting: Sports Medicine

## 2023-12-30 ENCOUNTER — Telehealth: Payer: Self-pay | Admitting: Sports Medicine

## 2023-12-30 DIAGNOSIS — F40240 Claustrophobia: Secondary | ICD-10-CM

## 2023-12-30 MED ORDER — LORAZEPAM 0.5 MG PO TABS: ORAL_TABLET | ORAL | 0 refills | Status: DC

## 2023-12-30 NOTE — Telephone Encounter (Signed)
Nope, I called patient and informed of providers response. Also informed patient that the referral is good to go to be schedule at the [place] location.

## 2023-12-30 NOTE — Telephone Encounter (Signed)
Pt has his MRI yesterday at [facility] but was unable to complete. Discovered his claustrophobia and could not get through it.  Can we prescribe something for him to take so he can reschedule as he really wants to get this done.  Briana--pt is going to need his neighbor to drive him and would like to change location to [facility] in [place].

## 2023-12-30 NOTE — Progress Notes (Signed)
Ativan sent in to use 1 to 2 tablets of 0.5 mg 30 minutes prior to procedure.

## 2024-01-02 ENCOUNTER — Ambulatory Visit
Admission: RE | Admit: 2024-01-02 | Discharge: 2024-01-02 | Disposition: A | Discharge status: Home / Self Care | Payer: 59 | Source: Ambulatory Visit | Attending: Sports Medicine

## 2024-02-02 ENCOUNTER — Telehealth: Payer: Self-pay | Admitting: Sports Medicine

## 2024-02-02 ENCOUNTER — Ambulatory Visit (INDEPENDENT_AMBULATORY_CARE_PROVIDER_SITE_OTHER): Admitting: Sports Medicine

## 2024-02-02 DIAGNOSIS — M5030 Other cervical disc degeneration, unspecified cervical region: Secondary | ICD-10-CM

## 2024-02-02 DIAGNOSIS — M5010 Cervical disc disorder with radiculopathy, unspecified cervical region: Secondary | ICD-10-CM

## 2024-02-02 DIAGNOSIS — M542 Cervicalgia: Secondary | ICD-10-CM

## 2024-02-02 NOTE — Progress Notes (Signed)
 Alejandro Patel D.[facility] [address] [place] [address] Phone: [phone]    Virtual Visit Note  Assessment and Plan:       1. Neck pain 2. DDD (degenerative disc disease), cervical 3. Cervical disc disorder with radiculopathy of cervical region  -Chronic with exacerbation, subsequent visit - Patient continues to experience neck pain with left-sided radicular symptoms into left upper extremity consistent with cervical DDD with cervical radiculopathy - Discussed C-spine MRI results which included bilateral foraminal impingement at C5-6 and C6-7, and right sided foraminal impingement at C3-4 and C4-5. - Patient's symptoms are most consistent with left-sided impingement at C6-C7.  I recommend epidural CSI at left-sided C6/7.  Patient is needle phobic and prefers to not proceed with injection at this time.  He will call us if pain worsens or if neurologic symptoms worsen and we could place order at that time - Patient did not find that prednisone or meloxicam were beneficial, so discontinue both medications - Patient is using a prescription strength NSAID from a different provider, though I cannot see this in patient's records.  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  Pertinent previous records reviewed include C-spine MRI 01/02/2024  Follow Up: As needed.  Patient could call at any time if he wants [place] to order left-sided C6-7 epidural CSI and follow-up 2 weeks after    I connected with patient by Doxim[employer] video enabled telemedicine application and  verified that I am speaking with the correct person using two identifiers. Patient agreed to proceed via telephone. I discussed the limitations of evaluation and management by telemedicine and the availability of in person appointments. The patient expressed understanding and agreed to proceed.  Location: Patient: Cell phone from his car Provider: In office setting  Time of visit 18 minutes, which included telephone discussion, chart review, treatment plan discussion with patient, and documentation at today's telemedicine visit.    Subjective:     Chief Complaint: neck pain    HPI:    11/16/2020 Patient has sigs of whiplash with some mild cervical radiculopathy.  No significant weakness noted of the arm.  Patient does have some limitation in extension of the neck as well as sidebending.  Patient did have x-rays at an outside facility and we will try to get them.  Patient has been told that he does have some arthritic changes.  Started on low-dose of gabapentin, given home exercises.  Patient was given prednisone initially which made some mild improvement.  Discussed with patient that if any worsening symptoms such as weakness of the arm, worsening neck pain, headaches to seek medical attention immediately.  Patient will be following up with me again in 3 to 4 weeks.  Could consider the possibility of manipulation if radicular symptoms improved worsening symptoms need ot consider PT and advance imaging.    Update 01/04/2021 Alejandro Patel is a 58 y.o. male coming in with complaint of cervical spine pain. Patient states his neck is ok. Still having some issues but has improved.  Patient states approximately 30 to 40% improvement.  Still  has more tightness.  Not taking anything for pain at this time. Patient was given a prescription for the gabapentin but not taking it regularly.   12/10/23 Patient states that his neck pain has come back. He went to PCP and rx diclofenac and prednisone they helped  initially but the pain is back to the same... this was roughly 2-3 weeks ago. He was told that his pain and neuropathy are coming from his neck   02/02/2024 Patient states that he continues to have neck pain with left-sided radicular symptoms.  He is only use gabapentin a few times and could not tell a dramatic difference in symptoms.  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     Current Outpatient Medications:    acetaminophen (TYLENOL) 500 MG tablet, Take 1,000 mg by mouth every 8 (eight) hours as needed (pain)., Disp: , Rfl:    Calcium-Magnesium-Zinc (CAL-MAG-ZINC PO), Take 1 tablet by mouth daily., Disp: , Rfl:    famotidine (PEPCID) 40 MG tablet, Take 40 mg by mouth every evening. , Disp: , Rfl:    gabapentin (NEURONTIN) 100 MG capsule, Take 2 capsules (200 mg total) by mouth at bedtime., Disp: 180 capsule, Rfl: 3   hyoscyamine (LEVSIN SL) 0.125 MG SL tablet, Place 1 tablet (0.125 mg total) under the tongue every 4 (four) hours as needed., Disp: 10 tablet, Rfl: 0   pantoprazole (PROTONIX) 40 MG tablet, Take 40 mg by mouth daily as needed (acid reflux). , Disp: , Rfl:    Objective:    Alert and doing well.  Very pleasant over the phone  Electronically signed by:  Alejandro Patel D.[facility] 12:01 PM 02/02/24

## 2024-02-02 NOTE — Telephone Encounter (Signed)
 Patient called to see about getting an appointment to discuss his MRI results. He is in town today but we did not have an opening on the schedule. He asked if it was something that could be discussed on the phone.

## 2024-02-02 NOTE — Telephone Encounter (Signed)
 Added Patient to schedule for a phone visit at 11:45 today. (02/02/2024).

## 2024-02-02 NOTE — Telephone Encounter (Signed)
 Is this something we can overbook on the schedule today since Dr. Jean Rosenthal will be out the rest of the week? Patient stated he never knows when he will be in town and he is today.
